# Patient Record
Sex: Female | Born: 1970 | Race: White | Hispanic: No | Marital: Married | Smoking: Never smoker
Health system: Southern US, Community
[De-identification: ages and names within clinical notes are randomized; demographics above are authoritative.]

## PROBLEM LIST (undated history)

## (undated) DIAGNOSIS — Z9889 Other specified postprocedural states: Secondary | ICD-10-CM

## (undated) DIAGNOSIS — T4145XA Adverse effect of unspecified anesthetic, initial encounter: Secondary | ICD-10-CM

## (undated) DIAGNOSIS — Z853 Personal history of malignant neoplasm of breast: Secondary | ICD-10-CM

## (undated) DIAGNOSIS — R112 Nausea with vomiting, unspecified: Secondary | ICD-10-CM

## (undated) DIAGNOSIS — T8859XA Other complications of anesthesia, initial encounter: Secondary | ICD-10-CM

## (undated) HISTORY — PX: TYMPANOSTOMY TUBE PLACEMENT: SHX32

## (undated) HISTORY — PX: BREAST ENHANCEMENT SURGERY: SHX7

## (undated) HISTORY — PX: TONSILLECTOMY: SUR1361

---

## 2015-12-02 ENCOUNTER — Telehealth: Payer: Self-pay | Admitting: *Deleted

## 2015-12-02 DIAGNOSIS — C50411 Malignant neoplasm of upper-outer quadrant of right female breast: Secondary | ICD-10-CM

## 2015-12-02 NOTE — Telephone Encounter (Signed)
Confirmed [facility] for 12/08/15 at 815am .  Instructions and contact information given.

## 2015-12-08 ENCOUNTER — Encounter: Payer: Self-pay | Admitting: Genetic Counselor

## 2015-12-08 ENCOUNTER — Ambulatory Visit (HOSPITAL_BASED_OUTPATIENT_CLINIC_OR_DEPARTMENT_OTHER): Payer: 59 | Admitting: Oncology

## 2015-12-08 ENCOUNTER — Encounter: Payer: Self-pay | Admitting: Nurse Practitioner

## 2015-12-08 ENCOUNTER — Ambulatory Visit: Payer: 59 | Attending: Surgery | Admitting: Physical Therapy

## 2015-12-08 ENCOUNTER — Ambulatory Visit
Admission: RE | Admit: 2015-12-08 | Discharge: 2015-12-08 | Disposition: A | Discharge status: Home / Self Care | Payer: 59 | Source: Ambulatory Visit | Attending: Radiation Oncology | Admitting: Radiation Oncology

## 2015-12-08 ENCOUNTER — Encounter: Payer: Self-pay | Admitting: Skilled Nursing Facility1

## 2015-12-08 ENCOUNTER — Ambulatory Visit (HOSPITAL_BASED_OUTPATIENT_CLINIC_OR_DEPARTMENT_OTHER): Payer: 59 | Admitting: Genetic Counselor

## 2015-12-08 ENCOUNTER — Encounter: Payer: Self-pay | Admitting: *Deleted

## 2015-12-08 ENCOUNTER — Encounter: Payer: Self-pay | Admitting: Physical Therapy

## 2015-12-08 ENCOUNTER — Other Ambulatory Visit: Payer: 59

## 2015-12-08 ENCOUNTER — Other Ambulatory Visit (HOSPITAL_BASED_OUTPATIENT_CLINIC_OR_DEPARTMENT_OTHER): Payer: 59

## 2015-12-08 ENCOUNTER — Telehealth: Payer: Self-pay | Admitting: Oncology

## 2015-12-08 VITALS — BP 124/59 | HR 109 | Temp 97.8°F | Resp 18 | Wt 144.6 lb

## 2015-12-08 DIAGNOSIS — R293 Abnormal posture: Secondary | ICD-10-CM | POA: Insufficient documentation

## 2015-12-08 DIAGNOSIS — C50411 Malignant neoplasm of upper-outer quadrant of right female breast: Secondary | ICD-10-CM

## 2015-12-08 DIAGNOSIS — Z806 Family history of leukemia: Secondary | ICD-10-CM

## 2015-12-08 DIAGNOSIS — Z8042 Family history of malignant neoplasm of prostate: Secondary | ICD-10-CM

## 2015-12-08 DIAGNOSIS — D0511 Intraductal carcinoma in situ of right breast: Secondary | ICD-10-CM

## 2015-12-08 DIAGNOSIS — Z79811 Long term (current) use of aromatase inhibitors: Secondary | ICD-10-CM | POA: Diagnosis not present

## 2015-12-08 DIAGNOSIS — Z803 Family history of malignant neoplasm of breast: Secondary | ICD-10-CM | POA: Insufficient documentation

## 2015-12-08 DIAGNOSIS — C50911 Malignant neoplasm of unspecified site of right female breast: Secondary | ICD-10-CM | POA: Insufficient documentation

## 2015-12-08 LAB — COMPREHENSIVE METABOLIC PANEL
ALT: 9 U/L (ref 0–55)
ANION GAP: 9 meq/L (ref 3–11)
AST: 12 U/L (ref 5–34)
Albumin: 3.9 g/dL (ref 3.5–5.0)
Alkaline Phosphatase: 53 U/L (ref 40–150)
BILIRUBIN TOTAL: 0.38 mg/dL (ref 0.20–1.20)
BUN: 14.8 mg/dL (ref 7.0–26.0)
CO2: 27 meq/L (ref 22–29)
Calcium: 9.6 mg/dL (ref 8.4–10.4)
Chloride: 106 mEq/L (ref 98–109)
Creatinine: 0.8 mg/dL (ref 0.6–1.1)
Glucose: 81 mg/dl (ref 70–140)
POTASSIUM: 4.1 meq/L (ref 3.5–5.1)
SODIUM: 141 meq/L (ref 136–145)
TOTAL PROTEIN: 7.2 g/dL (ref 6.4–8.3)

## 2015-12-08 LAB — CBC WITH DIFFERENTIAL/PLATELET
BASO%: 0.4 % (ref 0.0–2.0)
BASOS ABS: 0 10*3/uL (ref 0.0–0.1)
EOS ABS: 0.1 10*3/uL (ref 0.0–0.5)
EOS%: 1.5 % (ref 0.0–7.0)
HCT: 42.8 % (ref 34.8–46.6)
HGB: 14.3 g/dL (ref 11.6–15.9)
LYMPH%: 24.1 % (ref 14.0–49.7)
MCH: 31.3 pg (ref 25.1–34.0)
MCHC: 33.5 g/dL (ref 31.5–36.0)
MCV: 93.5 fL (ref 79.5–101.0)
MONO#: 0.5 10*3/uL (ref 0.1–0.9)
MONO%: 6.3 % (ref 0.0–14.0)
NEUT%: 67.7 % (ref 38.4–76.8)
NEUTROS ABS: 4.9 10*3/uL (ref 1.5–6.5)
PLATELETS: 311 10*3/uL (ref 145–400)
RBC: 4.57 10*6/uL (ref 3.70–5.45)
RDW: 12.5 % (ref 11.2–14.5)
WBC: 7.3 10*3/uL (ref 3.9–10.3)
lymph#: 1.8 10*3/uL (ref 0.9–3.3)

## 2015-12-08 MED ORDER — TAMOXIFEN CITRATE 20 MG PO TABS: 20.0000 mg | ORAL_TABLET | Freq: Every day | ORAL | Status: AC

## 2015-12-08 NOTE — Therapy (Signed)
[place], [place], [address] Phone: [phone]   Fax:  [phone]  Physical Therapy Evaluation  Patient Details  Name: Belinda White MRN: [id number] Date of Birth: 1971/03/27 Referring Provider: Dr. Erroll Luna  Encounter Date: 12/08/2015      PT End of Session - 12/08/15 1036    Visit Number 1   Number of Visits 1   PT Start Time 0938   PT Stop Time 4034  Also saw pt from 1005-1015 for 29 minutes total   PT Time Calculation (min) 19 min   Activity Tolerance Patient tolerated treatment well   Behavior During Therapy [facility] for tasks assessed/performed      Past Medical History  Diagnosis Date  . Breast cancer [facility])     Past Surgical History  Procedure Laterality Date  . Cesarean section    . Breast enhancement surgery    . Tonsillectomy      There were no vitals filed for this visit.  Visit Diagnosis:  Breast cancer, female, right - Plan: PT plan of care cert/re-cert  Abnormal posture - Plan: PT plan of care cert/re-cert      Subjective Assessment - 12/08/15 1024    Subjective Patient reports she is here today to meet with her medical team for her newly diagnosed right breast cancer.   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  It is ER/PR positive.  Her breast multi-disciplinary team met to discuss her recommended treatment plan prior to seeing her today.   Patient Stated Goals Reduce lymphedema risk and learn post op shoulder ROM HEP   Currently in Pain? No/denies            [facility] PT Assessment - 12/08/15 0001    Assessment   Medical Diagnosis Right breast cancer   Referring Provider Dr. Marcello Moores [place]   Onset Date/Surgical Date 11/24/15   Hand Dominance Right   Prior Therapy none   Precautions   Precautions Other (comment)   Precaution Comments Active  breast cancer   Restrictions   Weight Bearing Restrictions No   Balance Screen   Has the patient fallen in the past 6 months No   Has the patient had a decrease in activity level because of a fear of falling?  No   Is the patient reluctant to leave their home because of a fear of falling?  No   Home Ecologist residence   Living Arrangements Spouse/significant other;Children  Husband, 25 y.o. stepson and 18 y.o. 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   Leisure She does not exercise   Cognition   Overall Cognitive Status Within Functional Limits for tasks assessed   Posture/Postural Control   Posture/Postural Control Postural limitations   Postural Limitations Rounded Shoulders;Forward head   ROM / Strength   AROM / PROM / Strength AROM;Strength   AROM   AROM Assessment Site Shoulder   Right/Left Shoulder Right;Left   Right Shoulder Extension 55 Degrees   Right Shoulder Flexion 158 Degrees   Right Shoulder ABduction 160 Degrees   Right Shoulder Internal Rotation 72 Degrees   Right Shoulder External Rotation 81 Degrees   Left Shoulder  Extension 50 Degrees   Left Shoulder Flexion 144 Degrees   Left Shoulder ABduction 167 Degrees   Left Shoulder Internal Rotation 68 Degrees   Left Shoulder External Rotation 83 Degrees   Strength   Overall Strength Within functional limits for tasks performed           LYMPHEDEMA/ONCOLOGY QUESTIONNAIRE - 12/08/15 1029    Type   Cancer Type Right breast cancer   Lymphedema Assessments   Lymphedema Assessments Upper extremities   Right Upper Extremity Lymphedema   10 cm Proximal to Olecranon Process 25.5 cm   Olecranon Process 22.6 cm   10 cm Proximal to Ulnar Styloid Process 21.1 cm   Just Proximal to Ulnar Styloid Process 14.3 cm    Across Hand at [employer] 17.3 cm   At [place] of 2nd Digit 5.5 cm   Left Upper Extremity Lymphedema   10 cm Proximal to Olecranon Process 25.5 cm   Olecranon Process 22 cm   10 cm Proximal to Ulnar Styloid Process 19.2 cm   Just Proximal to Ulnar Styloid Process 14.5 cm   Across Hand at [employer] 17.4 cm   At [place] of 2nd Digit 5.6 cm       Patient was instructed today in a home exercise program today for post op shoulder range of motion. These included active assist shoulder flexion in sitting, scapular retraction, wall walking with shoulder abduction, and hands behind head external rotation.  She was encouraged to do these twice a day, holding 3 seconds and repeating 5 times when permitted by her physician.           PT Education - 12/08/15 1036    Education provided Yes   Education Details Lymphedema risk reduction and post op shoulder ROM HEP   Person(s) Educated Patient;Parent(s)   Methods Explanation;Demonstration;Handout   Comprehension Verbalized understanding;Returned demonstration              Breast Clinic Goals - 12/08/15 1047    Patient will be able to verbalize understanding of pertinent lymphedema risk reduction practices relevant to her diagnosis specifically related to skin care.   Time 1   Period Days   Status Achieved   Patient will be able to return demonstrate and/or verbalize understanding of the post-op home exercise program related to regaining shoulder range of motion.   Time 1   Period Days   Status Achieved   Patient will be able to verbalize understanding of the importance of attending the postoperative After Breast Cancer Class for further lymphedema risk reduction education and therapeutic exercise.   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  It is ER/PR  positive.  Her breast multi-disciplinary team met to discuss her recommended treatment plan prior to seeing her today.  She is planning to have genetic testing and if positive, a bilateral mastectomy with a right sentinel node biopsy.  If genetics are negative, she may consider only a right mastectomy and sentinel node biopsy.  She will also begin Tamoxifen neoadjuvantly.  She will benefit from post op PT to regain shoulder ROM and strength and reduce lymphedema risk.   Pt will benefit from skilled therapeutic intervention in order to improve on the following deficits Decreased strength;Decreased knowledge of  precautions;Pain;Impaired UE functional use;Decreased range of motion   Rehab Potential Excellent   Clinical Impairments Affecting Rehab Potential None   PT Frequency One time visit   PT Treatment/Interventions Therapeutic exercise;Patient/family education   PT Next Visit Plan Will f/u after surgery   PT Home Exercise Plan Shoulder ROM HEP for post op   Consulted and Agree with Plan of Care Patient;Family member/caregiver   Family Member Consulted Parents       Patient will follow up at outpatient cancer rehab if needed following surgery.  If the patient requires physical therapy at that time, a specific plan will be dictated and sent to the referring physician for approval. The patient was educated today on appropriate basic range of motion exercises to begin post operatively and the importance of attending the After Breast Cancer class following surgery.  Patient was educated today on lymphedema risk reduction practices as it pertains to recommendations that will benefit the patient immediately following surgery.  She verbalized good understanding.  No additional physical therapy is indicated at this time.      Problem List Patient Active Problem List   Diagnosis Date Noted  . Breast cancer of upper-outer quadrant of right female breast [facility]) 12/02/2015    Annia Friendly,  PT 12/08/2015 10:50 AM   [place], [place], [address] Phone: [phone]   Fax:  [phone]  Name: Belinda White MRN: [id number] Date of Birth: August 18, 1971

## 2015-12-08 NOTE — Progress Notes (Signed)
[place]  [phone] [phone]     ID: Belinda White DOB: 1970-12-29  MR#: [id number]  ONG#:[id number]  Patient Care Team: Leone Haven, MD as PCP - General (Family Medicine) Erroll Luna, MD as Consulting Physician (General Surgery) Thea Silversmith, MD as Consulting Physician (Radiation Oncology) Chauncey Cruel, MD as Consulting Physician (Oncology) PCP: Leone Haven, MD GYN: OTHER MD:  CHIEF COMPLAINT: Ductal carcinoma in situ  CURRENT TREATMENT: Tamoxifen, awaiting definitive surgery   BREAST CANCER HISTORY: Belinda White had routine screening mammography at [facility] 11/24/2015 with tomosynthesis. The breast density was category C. In the upper outer quadrant of the right breast there was an area of pleomorphic calcifications injuring 6 cm. She was recalled for unilateral diagnostic right mammography 11/26/2015, and this confirmed the earlier impression of a large area of suspicious calcifications involving both lower quadrants. Right breast ultrasonography on the same day showed a pattern suggestive of ductal ectasia with intraductal material in all quadrants, but no specific focus and a negative axilla.  On 11/29/2015 the patient underwent biopsy of 2 areas of calcifications, both in the lower quadrants, and both showed (SAA 17-05/10/2005) similar ductal carcinoma in situ, grade 1 or 2, strongly estrogen and progesterone receptor positive.  Her subsequent history is as detailed below  INTERVAL HISTORY: Belinda White was evaluated at the multidisciplinary breast cancer clinic [facility] 05/31/2016 accompanied by both her parents. Her case was also presented in the multidisciplinary breast cancer conference that same morning. At that time a preliminary plan was proposed: Mastectomy with sentinel lymph node sampling, followed by anti-estrogens. Genetics counseling was also recommended.  REVIEW OF SYSTEMS: There were no specific symptoms leading to the  original mammogram, which was routinely scheduled. The patient denies unusual headaches, visual changes, nausea, vomiting, stiff neck, dizziness, or gait imbalance. There has been no cough, phlegm production, or pleurisy, no chest pain or pressure, and no change in bowel or bladder habits. The patient denies fever, rash, bleeding, unexplained fatigue or unexplained weight loss. A detailed review of systems was otherwise entirely negative.  PAST MEDICAL HISTORY: Past Medical History  Diagnosis Date  . Breast cancer ([place])     PAST SURGICAL HISTORY: Past Surgical History  Procedure Laterality Date  . Cesarean section    . Breast enhancement surgery    . Tonsillectomy      FAMILY HISTORY Family History  Problem Relation Age of Onset  . Breast cancer  42  . Breast cancer Maternal Grandmother   The patient's father is 62 and the patient's mother 75 as of March 2017. The patient had no siblings. The maternal grandmother was diagnosed with breast cancer at age 19. She survive  GYNECOLOGIC HISTORY:  No LMP recorded. Menarche age 77 and first live birth age 24, the patient is GX P3. She still having regular periods. She has used oral contraceptives and most recently Depo purpura for contraception. 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 Her husband Belinda White is Librarian, academic of a local company. Daughter Belinda White lives in [place] where she works as an Surveyor, minerals. Daughter Belinda White lives in [place] and is unemployed at present. Daughter Belinda White is 67 and lives with the patient at [place].    ADVANCED DIRECTIVES: Not in place   HEALTH MAINTENANCE: Social History  Substance Use Topics  . Smoking status: Never Smoker   . Smokeless tobacco: None  . Alcohol Use: No     Colonoscopy:  PAP:  Bone density:  Lipid panel:  Not on File  No current outpatient prescriptions on file.   No current facility-administered medications for this  visit.    OBJECTIVE: Young white woman who appears well Filed Vitals:   12/08/15 0915  BP: 124/59  Pulse: 109  Temp: 97.8 F (36.6 C)  Resp: 18     There is no height on file to calculate BMI.    ECOG FS:0 - Asymptomatic  Ocular: Sclerae unicteric, pupils equal, round and reactive to light Ear-nose-throat: Oropharynx clear and moist Lymphatic: No cervical or supraclavicular adenopathy Lungs no rales or rhonchi, good excursion bilaterally Heart regular rate and rhythm, no murmur appreciated Abd soft, nontender, positive bowel sounds MSK no focal spinal tenderness, no joint edema Neuro: non-focal, well-oriented, appropriate affect Breasts: The right breast is status post recent biopsy. I do not palpate any mass and there is no nipple or skin change of concern. The right axilla is benign. The left breast is unremarkable   LAB RESULTS:  CMP     Component Value Date/Time   NA 141 12/08/2015 0834   K 4.1 12/08/2015 0834   CO2 27 12/08/2015 0834   GLUCOSE 81 12/08/2015 0834   BUN 14.8 12/08/2015 0834   CREATININE 0.8 12/08/2015 0834   CALCIUM 9.6 12/08/2015 0834   PROT 7.2 12/08/2015 0834   ALBUMIN 3.9 12/08/2015 0834   AST 12 12/08/2015 0834   ALT 9 12/08/2015 0834   ALKPHOS 53 12/08/2015 0834   BILITOT 0.38 12/08/2015 0834    INo results found for: SPEP, UPEP  Lab Results  Component Value Date   WBC 7.3 12/08/2015   NEUTROABS 4.9 12/08/2015   HGB 14.3 12/08/2015   HCT 42.8 12/08/2015   MCV 93.5 12/08/2015   PLT 311 12/08/2015      Chemistry      Component Value Date/Time   NA 141 12/08/2015 0834   K 4.1 12/08/2015 0834   CO2 27 12/08/2015 0834   BUN 14.8 12/08/2015 0834   CREATININE 0.8 12/08/2015 0834      Component Value Date/Time   CALCIUM 9.6 12/08/2015 0834   ALKPHOS 53 12/08/2015 0834   AST 12 12/08/2015 0834   ALT 9 12/08/2015 0834   BILITOT 0.38 12/08/2015 0834       No results found for: LABCA2  No components found for: LABCA125  No  results for input(s): INR in the last 168 hours.  Urinalysis No results found for: COLORURINE, APPEARANCEUR, LABSPEC, PHURINE, GLUCOSEU, HGBUR, BILIRUBINUR, KETONESUR, PROTEINUR, UROBILINOGEN, NITRITE, LEUKOCYTESUR    ELIGIBLE FOR AVAILABLE RESEARCH PROTOCOL: No  STUDIES: Outside studies reviewed  ASSESSMENT: 45 y.o. [place], [place] woman status post right breast lower quadrant biopsy 2 for ductal carcinoma in situ, grade 1 or 2, estrogen and progesterone receptor positive.  (1) right mastectomy with sentinel lymph node sampling planned  (2) genetics testing pending; if the patient proves to carry a deleterious mutation she has decided she would want bilateral mastectomies  (3) neoadjuvant tamoxifen started as there may be significant delays to definitive surgery while genetics testinging and plastic surgery consultation is obtained  PLAN: We spent the better part of today's hour-long appointment discussing the biology of breast cancer in general, and the specifics of the patient's tumor in particular.The patient understands that in noninvasive ductal carcinoma, also called ductal carcinoma in situ ("DCIS") the breast cancer cells remain trapped in the ducts were they started. They cannot travel to a vital organ. For that reason these cancers in themselves are not  life-threatening.  If the whole breast is removed then all the ducts are removed and since the cancer cells are trapped in the ducts, the cure rate with mastectomy for noninvasive breast cancer is approximately 99%. Nevertheless we recommend lumpectomy, because there is no survival advantage to mastectomy and because the cosmetic result is generally superior with breast conservation.  Darnice qualifies for genetic testing, and this is planned for 12/09/2015. In patients who carry a deleterious mutations such as for example BRCA, the risk of a new breast cancer developing may be sufficiently great that the patient may choose  bilateral mastectomies. However if the patient wishes to keep her breasts in that situation it is safe to do so. That would require intensified screening, which generally means not only yearly mammography but a yearly breast MRI as well. Of course, if there is a deleterious mutation bilateral oophorectomy would be necessary as there is no standard screening protocol for ovarian cancer.  In estrogen receptor positive cancers like Cassity's, anti-estrogens can also be considered. They will further reduce the risk of recurrence by one half. In addition anti-estrogens will lower the risk of a new breast cancer developing in either breast also by one half. That risk approaches 1% per year. Anti-estrogens reduce it to 1/2%.  Accordingly the overall plan is for genetics testing, to help decide the extensive surgery; at least unilateral mastectomy with reconstruction; then anti-estrogens. Because his significant delays anticipated between the time of genetics testing, plastics discussion, and surgical scheduling, we are starting the patient on neoadjuvant tamoxifen. We discussed the possible toxicities, side effects and complications of this agent. This will also be useful in that the patient will now whether she can tolerate this for 5 years postop or not.  Edwinna has a good understanding of the overall plan. She agrees with it. She knows the goal of treatment in her case is cure. She will call with any problems that may develop before her next visit here.  Chauncey Cruel, MD   12/08/2015 9:42 AM Medical Oncology and Hematology [facility] [address] [place], [place] [address] Tel. [phone]    Fax. [phone]

## 2015-12-08 NOTE — Progress Notes (Signed)
Belinda White is a very pleasant 45 y.o. female from [place], [place] with newly diagnosed ductal carcinoma in situ of the right breast.  Biopsy results revealed the tumor's prognostic profile is ER positive and PR positive.  She presents today with her parents to the [facility]) for treatment consideration and recommendations from the breast surgeon, radiation oncologist, and medical oncologist.     I briefly met with Belinda White and her parents during her [facility] visit today. We discussed the purpose of the Survivorship Clinic, which will include monitoring for recurrence, coordinating completion of age and gender-appropriate cancer screenings, promotion of overall wellness, as well as managing potential late/long-term side effects of anti-cancer treatments.    The treatment plan for Belinda White will likely include surgery, reconstruction, and neaodjuvant anti-estrogen therapy with tamoxifen.  She will meet with the Genetics Counselor due to her family history of breast cancer and age.  As of today, the intent of treatment for Belinda White is cure, therefore she will be eligible for the Survivorship Clinic upon her completion of treatment.  Her survivorship care plan (SCP) document will be drafted and updated throughout the course of her treatment trajectory. She will receive the SCP in an office visit with myself in the Survivorship Clinic once she has completed treatment.   Belinda White was encouraged to ask questions and all questions were answered to her satisfaction.  She was given my business card and encouraged to contact me with any concerns regarding survivorship.  I look forward to participating in her care.   Kenn File, [place] [phone]

## 2015-12-08 NOTE — Progress Notes (Signed)
[facility] Breast Clinic Psychosocial Distress Screening Clinical Social Work  Clinical Social Work had planned to meet with pt at [facility] to introduce self, explain role of CSW/Support Team and review distress screening protocol. Pt was sent to Genetics Appt and CSW missed seeing pt as a result.  The patient scored a 3 on the Psychosocial Distress Thermometer which indicates little distress. Per Belinda White, pt was provided with CSW/Support Team info and CSW can follow up with pt at future appointments as needs arise.   ONCBCN DISTRESS SCREENING 12/08/2015  Screening Type Initial Screening  Distress experienced in past week (1-10) 3  Family Problem type Children  Emotional problem type Nervousness/Anxiety;Adjusting to illness  Referral to clinical social work Yes  Referral to support programs Yes    Clinical Social Worker follow up needed: No.  If yes, follow up plan:  Belinda White, [place]  [facility] Phone: [phone] Fax: [phone]

## 2015-12-08 NOTE — Progress Notes (Signed)
Subjective:     Patient ID: Belinda White, female   DOB: Jul 27, 1971, 45 y.o.   MRN: [id number]  HPI   Review of Systems     Objective:   Physical Exam For the patient to understand and be given the tools to implement a healthy plant based diet during their cancer diagnosis.     Assessment:     Patient was seen today and found to be in good spirits and accompanied by her parents.Marland Kitchen Pts wt 144 pounds. Pt was responsive to the nutrition recommendations and feels good about eating a healthy diet. Pt states she is going to start walking 30 minutes every day. Pt states her family of 4 drinks 6 gallons of milk every week. Pt states she drinks half a large [employer] sweet tea every morning and 2 cups of chocolate milk every day.      Plan:     Dietitian educated the patient on implementing a plant based diet by incorporating more plant proteins, fruits, and vegetables. As a part of a healthy routine physical activity was discussed. Dietitian offered the option of trying to make small changes like only drinking one cup of chocolate milk.  The importance of legitimate, evidence based information was discussed and examples were given. A folder of evidence based information with a focus on a plant based diet and general nutrition during cancer was given to the patient.  As a part of the continuum of care the cancer dietitian's contact information was given to the patient in the event they would like to have a follow up appointment.

## 2015-12-08 NOTE — Patient Instructions (Signed)
Physical Therapy Information for After Breast Cancer Surgery/Treatment:   Lymphedema is a swelling condition that you may be at risk for in your arm if you have lymph nodes removed from the armpit area.  After a sentinel node biopsy, the risk is approximately 5-9% and is higher after an axillary node dissection.  There is treatment available for this condition and it is not life-threatening.  Contact your physician or physical therapist with concerns.  You may begin the 4 shoulder/posture exercises (see additional sheet) when permitted by your physician (typically a week after surgery).  If you have drains, you may need to wait until those are removed before beginning range of motion exercises.  A general recommendation is to not lift your arms above shoulder height until drains are removed.  These exercises should be done to your tolerance and gently.  This is not a "no pain/no gain" type of recovery so listen to your body and stretch into the range of motion that you can tolerate, stopping if you have pain.  If you are having immediate reconstruction, ask your plastic surgeon about doing exercises as he or she may want you to wait.  We encourage you to attend the free one time ABC (After Breast Cancer) class offered by  [facility].  You will learn information related to lymphedema risk, prevention and treatment and additional exercises to regain mobility following surgery.  You can call [phone] for more information.  This is offered the 1st and 3rd Monday of each month.  You only attend the class one time.  While undergoing any medical procedure or treatment, try to avoid blood pressure being taken or needle sticks from occurring on the arm on the side of cancer.   This recommendation begins after surgery and continues for the rest of your life.  This may help reduce your risk of getting lymphedema (swelling in your arm).  An excellent resource for those seeking information  on lymphedema is the National Lymphedema Network's web site. It can be accessed at www.lymphnet.org  If you notice swelling in your hand, arm or breast at any time following surgery (even if it is many years from now), please contact your doctor or physical therapist to discuss this.  Lymphedema can be treated at any time but it is easier for you if it is treated early on.  If you feel like your shoulder motion is not returning to normal in a reasonable amount of time, please contact your surgeon or physical therapist.  Belinda White, PT, CLT [phone]; [address] [place], [place] [address] ABC CLASS After Breast Cancer Class  After Breast Cancer Class is a specially designed exercise class to assist you in a safe recover after having breast cancer surgery.  In this class you will learn how to get back to full function whether your drains were just removed or if you had surgery a month ago.  This one-time class is held the 1st and 3rd Monday of every month from 11:00 a.m. until 12:00 noon at the [facility] located at [address] [place], [place] [address]  This class is FREE and space is limited. For more information or to register for the next available class, call [phone].  Class Goals   Understand specific stretches to improve the flexibility of you chest and shoulder.  Learn ways to safely strengthen your upper body and improve your posture.  Understand the warning signs of infection and why   you may be at risk for an arm infection.  Learn about Lymphedema and prevention.  ** You do not attend this class until after surgery.  Drains must be removed to participate  Patient will follow up at outpatient cancer rehab if needed following surgery.  If the patient requires physical therapy at that time, a specific plan will be dictated and sent to the referring physician for approval. The patient was educated today on appropriate basic range  of motion exercises to begin post operatively and the importance of attending the After Breast Cancer class following surgery.  Patient was educated today on lymphedema risk reduction practices as it pertains to recommendations that will benefit the patient immediately following surgery.  She verbalized good understanding.  No additional physical therapy is indicated at this time.    

## 2015-12-08 NOTE — Telephone Encounter (Signed)
Spoke with patient to confirm June 1 appt date/time

## 2015-12-08 NOTE — Progress Notes (Signed)
REFERRING PROVIDER: Leone Haven, MD [address] [place] [address]   Lurline Del, MD  PRIMARY PROVIDER:  Leone Haven, MD  PRIMARY REASON FOR VISIT:  1. Breast cancer of upper-outer quadrant of right female breast ([place])   2. Family history of breast cancer   3. Family history of leukemia   4. Family history of prostate cancer      HISTORY OF PRESENT ILLNESS:   Belinda White, a 45 y.o. female, was seen for a [facility] cancer genetics consultation at the request of Dr. Jana Hakim due to a personal and family history of cancer.  Belinda White presents to clinic today to discuss the possibility of a hereditary predisposition to cancer, genetic testing, and to further clarify her future cancer risks, as well as potential cancer risks for family members.   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 This will be treated with unilateral mastectomy and tamoxifen.  If genetics is positive she will consider double mastectomy.   CANCER HISTORY:   No history exists.     HORMONAL RISK FACTORS:  Menarche was at age 3.  First live birth at age 63.  OCP use for approximately 0 years.  Ovaries intact: yes.  Hysterectomy: no.  Menopausal status: premenopausal.  HRT use: 0 years. Colonoscopy: no; not examined. Mammogram within the last year: yes. Number of breast biopsies: 2. Up to date with pelvic exams:  yes. Any excessive radiation exposure in the past:  no  Past Medical History  Diagnosis Date  . Breast cancer ([place])   . Family history of breast cancer   . Family history of leukemia   . Family history of prostate cancer     Past Surgical History  Procedure Laterality Date  . Cesarean section    . Breast enhancement surgery    . Tonsillectomy      Social History   Social History  . Marital Status: Married    Spouse Name: Pilar Plate  . Number of Children: 3  . Years of Education: N/A   Social  History Main Topics  . Smoking status: Never Smoker   . Smokeless tobacco: None  . Alcohol Use: No  . Drug Use: No  . Sexual Activity: Not Asked   Other Topics Concern  . None   Social History Narrative     FAMILY HISTORY:  We obtained a detailed, 4-generation family history.  Significant diagnoses are listed below: Family History  Problem Relation Age of Onset  . Breast cancer Maternal Grandmother 60  . COPD Maternal Grandmother   . Leukemia Father     dx in his 84s-40s  . Heart attack Paternal Uncle   . Prostate cancer Maternal Grandfather   . Lung cancer Maternal Grandfather   . Stroke Paternal Grandmother   . Lung cancer Paternal Grandfather   . Diabetes type I Maternal Uncle   . Breast cancer Cousin     mother's maternal cousin  . Breast cancer Cousin     mother's maternal cousin's daughter  . Breast cancer Cousin     father's maternal cousin    The patient has three daughters who are healthy and cancer free.  She is the only child to her parents.  Her father was diagnosed with leukemia in his 105s-40s.  He had one brother who died of a heart attack.  His mother died of a stroke and his father had lung cancer.  His  father had several brothers and sisters who all died of cancer.  His mother had one brother who had a daughter with breast cancer.  The patient's mother is cancer free at 20.  She has only one ovary.  She had three brothers, one who died of DM type 1 at 55.  Her mother (the patient's grandmother) had breast cancer at 87, and her father (the patient's grandfather) had prostate cancer at 48.  The patient's maternal grandmother had at least two brothers, one who had a granddaughter with breast cancer and another who had a daughter with breast cancer.  Patient's maternal ancestors are of unknown descent, and paternal ancestors are of [place] descent. There is no reported Ashkenazi Jewish ancestry. There is no known consanguinity.  GENETIC COUNSELING ASSESSMENT: Belinda White is a 45 y.o. female with a personal and family history of breast cancer which is somewhat suggestive of a hereditary cancer syndrome and predisposition to cancer. We, therefore, discussed and recommended the following at today's visit.   DISCUSSION: We discussed that about 5-10% of breast cancer is hereditary with most cases the result of BRCA mutations.  Other genes can increase the risk for breast cancer, including PALB2, ATM and CHEK2.  We reviewed the characteristics, features and inheritance patterns of hereditary cancer syndromes. We also discussed genetic testing, including the appropriate family members to test, the process of testing, insurance coverage and turn-around-time for results. We discussed the implications of a negative, positive and/or variant of uncertain significant result. We recommended Belinda White pursue genetic testing for the Breast/Ovarian cancer gene panel. The Breast/Ovarian gene panel offered by [facility] includes sequencing and rearrangement analysis for the following 20 genes:  ATM, BARD1, BRCA1, BRCA2, BRIP1, CDH1, CHEK2, EPCAM, FANCC, MLH1, MSH2, MSH6, NBN, PALB2, PMS2, PTEN, RAD51C, RAD51D, TP53, and XRCC2.     Based on Belinda White's personal and family history of cancer, she meets medical criteria for genetic testing. Despite that she meets criteria, she may still have an out of pocket cost. We discussed that if her out of pocket cost for testing is over $100, the laboratory will call and confirm whether she wants to proceed with testing.  If the out of pocket cost of testing is less than $100 she will be billed by the genetic testing laboratory.   PLAN: After considering the risks, benefits, and limitations, Belinda White  provided informed consent to pursue genetic testing and the blood sample was sent to [facility] for analysis of the Breast/[place] cancer. Results have been placed on a [facility], which should make them available within approximately 2 weeks' time, at  which point they will be disclosed by telephone to Belinda White, as will any additional recommendations warranted by these results. Belinda White will receive a summary of her genetic counseling visit and a copy of her results once available. This information will also be available in Epic. We encouraged Belinda White to remain in contact with cancer genetics annually so that we can continuously update the family history and inform her of any changes in cancer genetics and testing that may be of benefit for her family. Belinda White's questions were answered to her satisfaction today. Our contact information was provided should additional questions or concerns arise.  Lastly, we encouraged Belinda White to remain in contact with cancer genetics annually so that we can continuously update the family history and inform her of any changes in cancer genetics and testing that may be of benefit for this family.   Ms.  White's questions were answered to her satisfaction today. Our contact information was provided should additional questions or concerns arise. Thank you for the referral and allowing [place] to share in the care of your patient.   Wrangler Penning P. Florene Glen, [place], [facility] Certified Genetic Counselor [email or web address] phone: [phone]  The patient was seen for a total of 45 minutes in face-to-face genetic counseling.  This patient was discussed with Drs. Magrinat, Lindi Adie and/or Burr Medico who agrees with the above.    _______________________________________________________________________ For Office Staff:  Number of people involved in session: 2 Was an Intern/ student involved with case: no

## 2015-12-13 ENCOUNTER — Telehealth: Payer: Self-pay | Admitting: *Deleted

## 2015-12-13 NOTE — Telephone Encounter (Signed)
Spoke to pt concerning Belinda White from 12/08/15. Denies questions or concerns regarding dx or treatment care plan. Encourage pt to call with needs. Received verbal understanding. Contact information provided.

## 2015-12-16 ENCOUNTER — Encounter: Payer: Self-pay | Admitting: Genetic Counselor

## 2015-12-16 ENCOUNTER — Ambulatory Visit: Payer: Self-pay | Admitting: Genetic Counselor

## 2015-12-16 ENCOUNTER — Telehealth: Payer: Self-pay | Admitting: Genetic Counselor

## 2015-12-16 DIAGNOSIS — Z1379 Encounter for other screening for genetic and chromosomal anomalies: Secondary | ICD-10-CM

## 2015-12-16 DIAGNOSIS — Z803 Family history of malignant neoplasm of breast: Secondary | ICD-10-CM

## 2015-12-16 DIAGNOSIS — C50411 Malignant neoplasm of upper-outer quadrant of right female breast: Secondary | ICD-10-CM

## 2015-12-16 DIAGNOSIS — Z806 Family history of leukemia: Secondary | ICD-10-CM

## 2015-12-16 DIAGNOSIS — Z8042 Family history of malignant neoplasm of prostate: Secondary | ICD-10-CM

## 2015-12-16 NOTE — Progress Notes (Signed)
HPI: Belinda White was previously seen in the [facility] clinic due to a personal and family history of cancer and concerns regarding a hereditary predisposition to cancer. Please refer to our prior cancer genetics clinic note for more information regarding Belinda White's medical, social and family histories, and our assessment and recommendations, at the time. Belinda White's recent genetic test results were disclosed to her, as were recommendations warranted by these results. These results and recommendations are discussed in more detail below.  FAMILY HISTORY:  We obtained a detailed, 4-generation family history.  Significant diagnoses are listed below: Family History  Problem Relation Age of Onset  . Breast cancer Maternal Grandmother 77  . COPD Maternal Grandmother   . Leukemia Father     dx in his 57s-40s  . Heart attack Paternal Uncle   . Prostate cancer Maternal Grandfather   . Lung cancer Maternal Grandfather   . Stroke Paternal Grandmother   . Lung cancer Paternal Grandfather   . Diabetes type I Maternal Uncle   . Breast cancer Cousin     mother's maternal cousin  . Breast cancer Cousin     mother's maternal cousin's daughter  . Breast cancer Cousin     father's maternal cousin    The patient has three daughters who are healthy and cancer free. She is the only child to her parents. Her father was diagnosed with leukemia in his 62s-40s. He had one brother who died of a heart attack. His mother died of a stroke and his father had lung cancer. His father had several brothers and sisters who all died of cancer. His mother had one brother who had a daughter with breast cancer. The patient's mother is cancer free at 69. She has only one ovary. She had three brothers, one who died of DM type 1 at 21. Her mother (the patient's grandmother) had breast cancer at 89, and her father (the patient's grandfather) had prostate cancer at 55. The patient's maternal grandmother had  at least two brothers, one who had a granddaughter with breast cancer and another who had a daughter with breast cancer. Patient's maternal ancestors are of unknown descent, and paternal ancestors are of [place] descent. There is no reported Ashkenazi Jewish ancestry. There is no known consanguinity.  GENETIC TEST RESULTS: At the time of Belinda White's visit, we recommended she pursue genetic testing of the Breast/Ovarian cancer gene panel. The Breast/Ovarian gene panel offered by [facility] includes sequencing and rearrangement analysis for the following 20 genes:  ATM, BARD1, BRCA1, BRCA2, BRIP1, CDH1, CHEK2, EPCAM, FANCC, MLH1, MSH2, MSH6, NBN, PALB2, PMS2, PTEN, RAD51C, RAD51D, TP53, and XRCC2.   The report date is December 16, 2015.  Genetic testing was normal, and did not reveal a deleterious mutation in these genes. The test report has been scanned into EPIC and is located under the Molecular Pathology section of the Results Review tab.   We discussed with Belinda White that since the current genetic testing is not perfect, it is possible there may be a gene mutation in one of these genes that current testing cannot detect, but that chance is small. We also discussed, that it is possible that another gene that has not yet been discovered, or that we have not yet tested, is responsible for the cancer diagnoses in the family, and it is, therefore, important to remain in touch with cancer genetics in the future so that we can continue to offer Belinda White the most up to date genetic  testing.   CANCER SCREENING RECOMMENDATIONS: This result is reassuring and indicates that Belinda White likely does not have an increased risk for a future cancer due to a mutation in one of these genes. This normal test also suggests that Belinda White's cancer was most likely not due to an inherited predisposition associated with one of these genes.  Most cancers happen by chance and this negative test suggests that her cancer falls into this  category.  We, therefore, recommended she continue to follow the cancer management and screening guidelines provided by her oncology and primary healthcare provider.   RECOMMENDATIONS FOR FAMILY MEMBERS: Women in this family might be at some increased risk of developing cancer, over the general population risk, simply due to the family history of cancer. We recommended women in this family have a yearly mammogram beginning at age 35, or 64 years younger than the earliest onset of cancer, an an annual clinical breast exam, and perform monthly breast self-exams. Women in this family should also have a gynecological exam as recommended by their primary provider. All family members should have a colonoscopy by age 35.  FOLLOW-UP: Lastly, we discussed with Belinda White that cancer genetics is a rapidly advancing field and it is possible that new genetic tests will be appropriate for her and/or her family members in the future. We encouraged her to remain in contact with cancer genetics on an annual basis so we can update her personal and family histories and let her know of advances in cancer genetics that may benefit this family.   Our contact number was provided. Belinda White's questions were answered to her satisfaction, and she knows she is welcome to call us at anytime with additional questions or concerns.   Roma Kayser, MS, [facility] Certified Genetic Counselor [email or web address]

## 2015-12-16 NOTE — Telephone Encounter (Signed)
Revealed negative genetic test results.  Discussed that this does not indicate that there is a hereditary cause for her cancer.  She should keep in contact with genetics for any updated information in the future.

## 2015-12-31 ENCOUNTER — Ambulatory Visit: Payer: Self-pay | Admitting: Surgery

## 2015-12-31 DIAGNOSIS — D0511 Intraductal carcinoma in situ of right breast: Secondary | ICD-10-CM

## 2015-12-31 NOTE — H&P (Signed)
Belinda White 12/31/2015 9:11 AM Location: [facility] Patient #: [id number] DOB: [birth year] Married / Language: English / Race: White Female  History of Present Illness Marcello Moores A. Cherlynn Popiel MD; 12/31/2015 12:43 PM) Patient words: atient words: patient sent at the request of Dr. Pablo Ledger for abnormal mammogram. She has a history of bilateral saline implants. On her most recenomorphic calcification she had a 10 cm area of pleomorphic calcifications involving the right breast. Core biopsy showed DCIS. She was seen today in the multidisciplinary clinic. She has a family history of breast cancer. Denies any history of breast pain, nipple discharge or masses. Her implants have been in at least 5 years she thinks.    Patient returns after seeing Dr. Carol Ada and getting her genetic testing which is negative. She has interest in bilateral nipple sparing mastectomies. She has implants and all ready and may have to have these removed. I discussed with her the rationale for right simple mastectomy. I discussed that there is no survival benefit to bilateral mastectomies after reviewing the literature but given her young age she would have a lifetime risk of a new breast cancer in the left breast of well over 30%.  The patient is a 45 year old female.   Allergies (Sonya Bynum, CMA; 12/31/2015 9:12 AM) No Known Drug Allergies 12/31/2015  Medication History (Sonya Bynum, CMA; 12/31/2015 9:12 AM) Tamoxifen Citrate (20MG  Tablet, Oral) Active. Medications Reconciled    Vitals (Sonya Bynum CMA; 12/31/2015 9:12 AM) 12/31/2015 9:11 AM Weight: 145 lb Height: 66in Body Surface Area: 1.74 m Body Mass Index: 23.4 kg/m  Temp.: 68F(Temporal)  Pulse: 76 (Regular)  BP: 128/80 (Sitting, Left Arm, Standard)      Physical Exam (Shallyn Constancio A. Ival Pacer MD; 12/31/2015 12:43 PM)  General Mental Status-Alert. General Appearance-Consistent with stated age. Hydration-Well  hydrated. Voice-Normal.  Breast Breast - Left-Symmetric, Non Tender, No Biopsy scars, no Dimpling, No Inflammation, No Lumpectomy scars, No Mastectomy scars, No Peau d' Orange. Breast - Right-Symmetric, Non Tender, No Biopsy scars, no Dimpling, No Inflammation, No Lumpectomy scars, No Mastectomy scars, No Peau d' Orange. Breast Lump-No Palpable Breast Mass.  Cardiovascular Cardiovascular examination reveals -normal heart sounds, regular rate and rhythm with no murmurs and normal pedal pulses bilaterally.  Lymphatic Head & Neck  General Head & Neck Lymphatics: Bilateral - Description - Normal. Axillary  General Axillary Region: Bilateral - Description - Normal. Tenderness - Non Tender.    Assessment & Plan (Myalee Stengel A. Dolorez Jeffrey MD; 12/31/2015 12:43 PM)  BREAST NEOPLASM, TIS (DCIS), RIGHT (D05.11) Impression: pt has seen genetics and is negative. Given her young age she has a lifetime risk of over 30 % for a new breast cancer on the left  she is leaning toward bilateral mastectomy with left prophylactic due to lifetime risk  she will let me know  she has seen Dr Iran Planas  Would proceed with NSM in her case with right SLN mapping  discussed options of simple mastectomy versus nipple preservation withplastic surgery versus delayed reconstruction Discussed sentinel lymph node mapping as well. Risk of sentinel lymph node mapping include bleeding, infection, lymphedema, shoulder pain. stiffness, dye allergy. cosmetic deformity , nipple loss blood clots, death, need for more surgery. Pt agres to proceed. Discussed treatment options for breast cancer to include breast conservation vs mastectomy with reconstruction. Pt has decided on mastectomy. Risk include bleeding, infection, flap necrosis, pain, numbness, numbness, pain, nipple loss, recurrence, hematoma, other surgery needs. Pt understands and agrees to proceed.   Discussed treatment options for  breast cancer to include breast  conservation vs mastectomy with reconstruction. Pt has decided on mastectomy. Risk include bleeding, infection, flap necrosis, pain, numbness, recurrence, hematoma, other surgery needs. Pt understands and agrees to proceed.  Current Plans You are being scheduled for surgery - Our schedulers will call you.  You should hear from our office's scheduling department within 5 working days about the location, date, and time of surgery. We try to make accommodations for patient's preferences in scheduling surgery, but sometimes the OR schedule or the surgeon's schedule prevents [place] from making those accommodations.  If you have not heard from our office [phone]) in 5 working days, call the office and ask for your surgeon's nurse.  If you have other questions about your diagnosis, plan, or surgery, call the office and ask for your surgeon's nurse.  We discussed the staging and pathophysiology of breast cancer. We discussed all of the different options for treatment for breast cancer including surgery, chemotherapy, radiation therapy, Herceptin, and antiestrogen therapy. We discussed a sentinel lymph node biopsy as she does not appear to having lymph node involvement right now. We discussed the performance of that with injection of radioactive tracer and blue dye. We discussed that she would have an incision underneath her axillary hairline. We discussed that there is a bout a 10-20% chance of having a positive node with a sentinel lymph node biopsy and we will await the permanent pathology to make any other first further decisions in terms of her treatment. One of these options might be to return to the operating room to perform an axillary lymph node dissection. We discussed about a 1-2% risk lifetime of chronic shoulder pain as well as lymphedema associated with a sentinel lymph node biopsy. We discussed the options for treatment of the breast cancer which included lumpectomy versus a mastectomy. We  discussed the performance of the lumpectomy with a wire placement. We discussed a 10-20% chance of a positive margin requiring reexcision in the operating room. We also discussed that she may need radiation therapy or antiestrogen therapy or both if she undergoes lumpectomy. We discussed the mastectomy and the postoperative care for that as well. We discussed that there is no difference in her survival whether she undergoes lumpectomy with radiation therapy or antiestrogen therapy versus a mastectomy. There is a slight difference in the local recurrence rate being 3-5% with lumpectomy and about 1% with a mastectomy. We discussed the risks of operation including bleeding, infection, possible reoperation. She understands her further therapy will be based on what her stages at the time of her operation.  Pt Education - [facility] Mastectomy HCI

## 2016-01-06 ENCOUNTER — Ambulatory Visit: Payer: Self-pay | Admitting: Surgery

## 2016-01-14 ENCOUNTER — Encounter: Payer: Self-pay | Admitting: *Deleted

## 2016-01-14 ENCOUNTER — Telehealth: Payer: Self-pay | Admitting: Oncology

## 2016-01-14 NOTE — Telephone Encounter (Signed)
lmom to inform patient of appt change 6/1 to 7/25 at 11 am

## 2016-02-10 ENCOUNTER — Ambulatory Visit: Payer: 59 | Admitting: Oncology

## 2016-02-10 DIAGNOSIS — Z853 Personal history of malignant neoplasm of breast: Secondary | ICD-10-CM

## 2016-02-10 HISTORY — DX: Personal history of malignant neoplasm of breast: Z85.3

## 2016-02-28 NOTE — H&P (Signed)
  Subjective:    Patient ID: Belinda White is a 45 y.o. female.  HPI  Plan bilateral breast reconstruction with expanders possible acellular dermis. Presented following screening MMG demonstrating in the upper outer quadrant of the right breast pleomorphic calcifications spanning 6 cm. Diagnostic MMG confirmedlarge area of suspicious calcifications involving both lower quadrants. [place] with negative axilla. Biopsy of 2 areas of calcifications, both in the lower quadrants, showed DCIS, ER/PR +. She was started on tamoxifen neoadjuvantly. Mastectomy has been recommended given extent disease. Plan NSM with Dr. Gilford Silvius negative/  History significant for augmentation saline submuscular 2012 with Dr. Nathanial Rancher. Current DD, happy with this. Prior to augmentation A/B cup.  Wt stable.  Review of Systems 12 point review negative    Objective:   Physical Exam  Cardiovascular: Normal rate. Normal heart sounds Pulmonary/Chest: Effort normal. Clear to auscultation Abdominal: Soft.  Genitourinary: No breast discharge.  Lymphadenopathy:  She has no axillary adenopathy.    Bilateral IMF scars though both migrated only lower pole breasts Soft, no contracture   SN to nipple R 21.5 : 22.5 cm BW R 13 L 13 cm Nipple to IMF R 11 L 10 cm  Assessment:     R breast DCIS S/p augmentation mammaplasty    Plan:     Signed ROI to obtain implant information. She would like to put off surgery until 5.26.17 or later as her kids will be out of school then if possible.   Reviewed implant based vs autologous reconstruction. Reviewed TRAM vs DIEP, if she pursues autologous and is bilateral, recommend consultation with DIEP surgeon. Reviewed implant based reconstruction and risks including contracture, infection requiring replacement implant or removal, rupture. Discussed NSM with IMF scars, will be longer than current. NAC and breast will be asensate, not stimulate. Reviewed that I recommend  placement expander over direct to implant reconstruction. Reviewed process expansion, future surgery to replace implant. Reviewed that likely her implant is not completely submuscular but dual plane. Discussed use acellular dermis or serratus for coverage of lower outer pole implant in reconstruction. Discussed process expansion, hospital stay, drains, post procedure limitations and visits. Counseled it is very likely reconstructed breast will be smaller volume, may not achieve DD again. Reviewed risk mastectomy flap necrosis including nipple, need for additional surgery pending margins. Discussed data published at this time indicated similar risk recurrence cancer with NSM vs SSM. 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 Reviewed portfolio pictures and expander examples. At this time she would like to pursue implant based reconstruction, is ok with placement expander rather than implant. Patient desires bilateral procedure.   Patient has Mentor Smooth Round Moderate Profile, Style 1600, 375 ml implants RIGHT fil 375 ml, LEFT fill 390 ml  Irene Limbo, MD [facility]astic [facility] [phone], pin [phone]

## 2016-02-29 ENCOUNTER — Encounter (HOSPITAL_BASED_OUTPATIENT_CLINIC_OR_DEPARTMENT_OTHER): Payer: Self-pay | Admitting: *Deleted

## 2016-03-07 ENCOUNTER — Ambulatory Visit (HOSPITAL_BASED_OUTPATIENT_CLINIC_OR_DEPARTMENT_OTHER): Payer: 59 | Admitting: Anesthesiology

## 2016-03-07 ENCOUNTER — Encounter (HOSPITAL_COMMUNITY)
Admission: RE | Admit: 2016-03-07 | Discharge: 2016-03-07 | Disposition: A | Discharge status: Home / Self Care | Payer: 59 | Source: Ambulatory Visit | Attending: Surgery | Admitting: Surgery

## 2016-03-07 ENCOUNTER — Encounter (HOSPITAL_BASED_OUTPATIENT_CLINIC_OR_DEPARTMENT_OTHER): Payer: Self-pay | Admitting: *Deleted

## 2016-03-07 ENCOUNTER — Encounter (HOSPITAL_BASED_OUTPATIENT_CLINIC_OR_DEPARTMENT_OTHER)
Admission: RE | Disposition: A | Discharge status: Home / Self Care | Payer: Self-pay | Source: Ambulatory Visit | Attending: Surgery

## 2016-03-07 ENCOUNTER — Ambulatory Visit (HOSPITAL_BASED_OUTPATIENT_CLINIC_OR_DEPARTMENT_OTHER)
Admission: RE | Admit: 2016-03-07 | Discharge: 2016-03-08 | Disposition: A | Discharge status: Home / Self Care | Payer: 59 | Source: Ambulatory Visit | Attending: Surgery | Admitting: Surgery

## 2016-03-07 DIAGNOSIS — Z79810 Long term (current) use of selective estrogen receptor modulators (SERMs): Secondary | ICD-10-CM | POA: Diagnosis not present

## 2016-03-07 DIAGNOSIS — N6022 Fibroadenosis of left breast: Secondary | ICD-10-CM | POA: Insufficient documentation

## 2016-03-07 DIAGNOSIS — N6091 Unspecified benign mammary dysplasia of right breast: Secondary | ICD-10-CM | POA: Diagnosis not present

## 2016-03-07 DIAGNOSIS — D0511 Intraductal carcinoma in situ of right breast: Secondary | ICD-10-CM | POA: Diagnosis present

## 2016-03-07 DIAGNOSIS — N6092 Unspecified benign mammary dysplasia of left breast: Secondary | ICD-10-CM | POA: Diagnosis not present

## 2016-03-07 DIAGNOSIS — D242 Benign neoplasm of left breast: Secondary | ICD-10-CM | POA: Diagnosis not present

## 2016-03-07 DIAGNOSIS — N6021 Fibroadenosis of right breast: Secondary | ICD-10-CM | POA: Diagnosis not present

## 2016-03-07 DIAGNOSIS — Z803 Family history of malignant neoplasm of breast: Secondary | ICD-10-CM | POA: Diagnosis not present

## 2016-03-07 DIAGNOSIS — Z4001 Encounter for prophylactic removal of breast: Secondary | ICD-10-CM | POA: Diagnosis not present

## 2016-03-07 HISTORY — DX: Other specified postprocedural states: Z98.890

## 2016-03-07 HISTORY — DX: Adverse effect of unspecified anesthetic, initial encounter: T41.45XA

## 2016-03-07 HISTORY — DX: Nausea with vomiting, unspecified: R11.2

## 2016-03-07 HISTORY — DX: Other complications of anesthesia, initial encounter: T88.59XA

## 2016-03-07 HISTORY — PX: NIPPLE SPARING MASTECTOMY/SENTINAL LYMPH NODE BIOPSY/RECONSTRUCTION/PLACEMENT OF TISSUE EXPANDER: SHX6484

## 2016-03-07 HISTORY — PX: BREAST RECONSTRUCTION WITH PLACEMENT OF TISSUE EXPANDER AND FLEX HD (ACELLULAR HYDRATED DERMIS): SHX6295

## 2016-03-07 HISTORY — PX: BREAST IMPLANT REMOVAL: SHX5361

## 2016-03-07 SURGERY — NIPPLE SPARING MASTECTOMY WITH SENTINAL LYMPH NODE BIOPSY AND  RECONSTRUCTION WITH PLACEMENT OF TISSUE EXPANDER
Anesthesia: General | Site: Breast | Laterality: Left

## 2016-03-07 SURGERY — Surgical Case
Anesthesia: *Unknown

## 2016-03-07 MED ORDER — TECHNETIUM TC 99M SULFUR COLLOID FILTERED
1.0000 | Freq: Once | INTRAVENOUS | Status: AC | PRN
  Administered 2016-03-07: 1 via INTRADERMAL

## 2016-03-07 MED ORDER — KETOROLAC TROMETHAMINE 30 MG/ML IJ SOLN
INTRAMUSCULAR | Status: AC
Start: 2016-03-07 — End: 2016-03-07
  Filled 2016-03-07: qty 1

## 2016-03-07 MED ORDER — ONDANSETRON 4 MG PO TBDP: 4.0000 mg | ORAL_TABLET | Freq: Four times a day (QID) | ORAL | Status: DC | PRN

## 2016-03-07 MED ORDER — FENTANYL CITRATE (PF) 100 MCG/2ML IJ SOLN
INTRAMUSCULAR | Status: AC
  Filled 2016-03-07: qty 2

## 2016-03-07 MED ORDER — PHENYLEPHRINE HCL 10 MG/ML IJ SOLN
INTRAMUSCULAR | Status: DC | PRN
  Administered 2016-03-07: 80 ug via INTRAVENOUS
  Administered 2016-03-07: 40 ug via INTRAVENOUS
  Administered 2016-03-07: 80 ug via INTRAVENOUS

## 2016-03-07 MED ORDER — FENTANYL CITRATE (PF) 100 MCG/2ML IJ SOLN
INTRAMUSCULAR | Status: DC | PRN
  Administered 2016-03-07 (×4): 25 ug via INTRAVENOUS
  Administered 2016-03-07: 100 ug via INTRAVENOUS

## 2016-03-07 MED ORDER — SODIUM CHLORIDE 0.9 % IV SOLN
INTRAVENOUS | Status: DC | PRN
  Administered 2016-03-07: 1000 mL

## 2016-03-07 MED ORDER — SCOPOLAMINE 1 MG/3DAYS TD PT72
1.0000 | MEDICATED_PATCH | Freq: Once | TRANSDERMAL | Status: DC | PRN
  Administered 2016-03-07: 1.5 mg via TRANSDERMAL

## 2016-03-07 MED ORDER — PROPOFOL 10 MG/ML IV BOLUS
INTRAVENOUS | Status: DC | PRN
  Administered 2016-03-07: 50 mg via INTRAVENOUS
  Administered 2016-03-07: 200 mg via INTRAVENOUS
  Administered 2016-03-07: 50 mg via INTRAVENOUS

## 2016-03-07 MED ORDER — ESMOLOL HCL 100 MG/10ML IV SOLN
INTRAVENOUS | Status: DC | PRN
  Administered 2016-03-07 (×2): 10 mg via INTRAVENOUS

## 2016-03-07 MED ORDER — HYDROMORPHONE HCL 1 MG/ML IJ SOLN
INTRAMUSCULAR | Status: AC
  Filled 2016-03-07: qty 1

## 2016-03-07 MED ORDER — PROMETHAZINE HCL 25 MG/ML IJ SOLN
INTRAMUSCULAR | Status: AC
  Filled 2016-03-07: qty 1

## 2016-03-07 MED ORDER — MIDAZOLAM HCL 2 MG/2ML IJ SOLN
1.0000 mg | INTRAMUSCULAR | Status: DC | PRN
  Administered 2016-03-07: 2 mg via INTRAVENOUS

## 2016-03-07 MED ORDER — METHOCARBAMOL 500 MG PO TABS
500.0000 mg | ORAL_TABLET | Freq: Three times a day (TID) | ORAL | Status: DC | PRN
  Administered 2016-03-07: 500 mg via ORAL
  Filled 2016-03-07: qty 1

## 2016-03-07 MED ORDER — LIDOCAINE HCL (CARDIAC) 20 MG/ML IV SOLN
INTRAVENOUS | Status: DC | PRN
  Administered 2016-03-07: 50 mg via INTRAVENOUS

## 2016-03-07 MED ORDER — METHYLENE BLUE 0.5 % INJ SOLN
INTRAVENOUS | Status: AC
Start: 2016-03-07 — End: 2016-03-07
  Filled 2016-03-07: qty 10

## 2016-03-07 MED ORDER — KETOROLAC TROMETHAMINE 30 MG/ML IJ SOLN
30.0000 mg | Freq: Three times a day (TID) | INTRAMUSCULAR | Status: AC
  Administered 2016-03-07 – 2016-03-08 (×3): 30 mg via INTRAVENOUS

## 2016-03-07 MED ORDER — MORPHINE SULFATE (PF) 10 MG/ML IV SOLN
INTRAVENOUS | Status: AC
  Filled 2016-03-07: qty 1

## 2016-03-07 MED ORDER — ONDANSETRON HCL 4 MG/2ML IJ SOLN
INTRAMUSCULAR | Status: DC | PRN
  Administered 2016-03-07: 4 mg via INTRAVENOUS

## 2016-03-07 MED ORDER — PROMETHAZINE HCL 25 MG/ML IJ SOLN
12.5000 mg | Freq: Four times a day (QID) | INTRAMUSCULAR | Status: DC | PRN
  Administered 2016-03-07: 12.5 mg via INTRAVENOUS

## 2016-03-07 MED ORDER — MIDAZOLAM HCL 2 MG/2ML IJ SOLN
INTRAMUSCULAR | Status: AC
  Filled 2016-03-07: qty 2

## 2016-03-07 MED ORDER — PHENYLEPHRINE 40 MCG/ML (10ML) SYRINGE FOR IV PUSH (FOR BLOOD PRESSURE SUPPORT)
PREFILLED_SYRINGE | INTRAVENOUS | Status: AC
  Filled 2016-03-07: qty 10

## 2016-03-07 MED ORDER — BUPIVACAINE-EPINEPHRINE (PF) 0.25% -1:200000 IJ SOLN
INTRAMUSCULAR | Status: AC
  Filled 2016-03-07: qty 30

## 2016-03-07 MED ORDER — DEXAMETHASONE SODIUM PHOSPHATE 4 MG/ML IJ SOLN
INTRAMUSCULAR | Status: DC | PRN
  Administered 2016-03-07: 10 mg via INTRAVENOUS

## 2016-03-07 MED ORDER — TAMOXIFEN CITRATE 20 MG PO TABS
20.0000 mg | ORAL_TABLET | Freq: Every day | ORAL | Status: DC
Start: 2016-03-07 — End: 2016-03-08

## 2016-03-07 MED ORDER — GLYCOPYRROLATE 0.2 MG/ML IJ SOLN: 0.2000 mg | Freq: Once | INTRAMUSCULAR | Status: DC | PRN

## 2016-03-07 MED ORDER — ESMOLOL HCL 100 MG/10ML IV SOLN
INTRAVENOUS | Status: AC
  Filled 2016-03-07: qty 10

## 2016-03-07 MED ORDER — MEPERIDINE HCL 25 MG/ML IJ SOLN
6.2500 mg | INTRAMUSCULAR | Status: DC | PRN
Start: 2016-03-07 — End: 2016-03-08

## 2016-03-07 MED ORDER — DEXAMETHASONE SODIUM PHOSPHATE 10 MG/ML IJ SOLN
INTRAMUSCULAR | Status: AC
  Filled 2016-03-07: qty 1

## 2016-03-07 MED ORDER — SODIUM CHLORIDE 0.9 % IJ SOLN
INTRAMUSCULAR | Status: AC
  Filled 2016-03-07: qty 10

## 2016-03-07 MED ORDER — LACTATED RINGERS IV SOLN
INTRAVENOUS | Status: DC
  Administered 2016-03-07 (×3): via INTRAVENOUS

## 2016-03-07 MED ORDER — FENTANYL CITRATE (PF) 100 MCG/2ML IJ SOLN
50.0000 ug | INTRAMUSCULAR | Status: DC | PRN
  Administered 2016-03-07: 100 ug via INTRAVENOUS

## 2016-03-07 MED ORDER — OXYCODONE HCL 5 MG PO TABS
5.0000 mg | ORAL_TABLET | ORAL | Status: DC | PRN
  Administered 2016-03-07: 10 mg via ORAL
  Administered 2016-03-07: 5 mg via ORAL
  Administered 2016-03-08: 10 mg via ORAL
  Filled 2016-03-07: qty 1
  Filled 2016-03-07 (×2): qty 2

## 2016-03-07 MED ORDER — HYDROMORPHONE HCL 1 MG/ML IJ SOLN
0.5000 mg | INTRAMUSCULAR | Status: DC | PRN
  Administered 2016-03-07: 1 mg via INTRAVENOUS
  Filled 2016-03-07: qty 1

## 2016-03-07 MED ORDER — DEXTROSE 5 % IV SOLN
3.0000 g | INTRAVENOUS | Status: AC
  Administered 2016-03-07: 2 g via INTRAVENOUS

## 2016-03-07 MED ORDER — MORPHINE SULFATE 10 MG/ML IJ SOLN
INTRAMUSCULAR | Status: DC | PRN
  Administered 2016-03-07: 4 mg via INTRAVENOUS

## 2016-03-07 MED ORDER — ONDANSETRON HCL 4 MG/2ML IJ SOLN
4.0000 mg | Freq: Four times a day (QID) | INTRAMUSCULAR | Status: DC | PRN
  Administered 2016-03-07: 4 mg via INTRAVENOUS
  Filled 2016-03-07: qty 2

## 2016-03-07 MED ORDER — KCL IN DEXTROSE-NACL 20-5-0.45 MEQ/L-%-% IV SOLN
INTRAVENOUS | Status: DC
  Administered 2016-03-07: 15:00:00 via INTRAVENOUS
  Filled 2016-03-07: qty 1000

## 2016-03-07 MED ORDER — PROPOFOL 10 MG/ML IV BOLUS
INTRAVENOUS | Status: AC
  Filled 2016-03-07: qty 40

## 2016-03-07 MED ORDER — LIDOCAINE 2% (20 MG/ML) 5 ML SYRINGE
INTRAMUSCULAR | Status: AC
  Filled 2016-03-07: qty 5

## 2016-03-07 MED ORDER — ONDANSETRON HCL 4 MG/2ML IJ SOLN
INTRAMUSCULAR | Status: AC
  Filled 2016-03-07: qty 2

## 2016-03-07 MED ORDER — HYDROMORPHONE HCL 1 MG/ML IJ SOLN
0.2500 mg | INTRAMUSCULAR | Status: DC | PRN
  Administered 2016-03-07 (×3): 0.5 mg via INTRAVENOUS

## 2016-03-07 MED ORDER — OXYCODONE HCL 5 MG PO TABS: 5.0000 mg | ORAL_TABLET | Freq: Once | ORAL | Status: DC | PRN

## 2016-03-07 MED ORDER — CEFAZOLIN SODIUM-DEXTROSE 2-4 GM/100ML-% IV SOLN
INTRAVENOUS | Status: AC
  Filled 2016-03-07: qty 100

## 2016-03-07 MED ORDER — SCOPOLAMINE 1 MG/3DAYS TD PT72
MEDICATED_PATCH | TRANSDERMAL | Status: AC
  Filled 2016-03-07: qty 1

## 2016-03-07 MED ORDER — KETOROLAC TROMETHAMINE 15 MG/ML IJ SOLN
INTRAMUSCULAR | Status: AC
  Filled 2016-03-07: qty 2

## 2016-03-07 MED ORDER — SUCCINYLCHOLINE CHLORIDE 200 MG/10ML IV SOSY
PREFILLED_SYRINGE | INTRAVENOUS | Status: AC
Start: 2016-03-07 — End: 2016-03-07
  Filled 2016-03-07: qty 10

## 2016-03-07 MED ORDER — SUCCINYLCHOLINE CHLORIDE 20 MG/ML IJ SOLN
INTRAMUSCULAR | Status: DC | PRN
  Administered 2016-03-07: 50 mg via INTRAVENOUS

## 2016-03-07 MED ORDER — CEFAZOLIN IN D5W 1 GM/50ML IV SOLN
1.0000 g | Freq: Three times a day (TID) | INTRAVENOUS | Status: AC
  Administered 2016-03-07 – 2016-03-08 (×3): 1 g via INTRAVENOUS
  Filled 2016-03-07 (×3): qty 50

## 2016-03-07 MED ORDER — MIDAZOLAM HCL 5 MG/5ML IJ SOLN
INTRAMUSCULAR | Status: DC | PRN
  Administered 2016-03-07: 2 mg via INTRAVENOUS

## 2016-03-07 MED ORDER — OXYCODONE HCL 5 MG/5ML PO SOLN: 5.0000 mg | Freq: Once | ORAL | Status: DC | PRN

## 2016-03-07 SURGICAL SUPPLY — 80 items
APPLIER CLIP 9.375 MED OPEN (MISCELLANEOUS) ×4
BAG DECANTER FOR FLEXI CONT (MISCELLANEOUS) ×4 IMPLANT
BINDER BREAST LRG (GAUZE/BANDAGES/DRESSINGS) IMPLANT
BINDER BREAST MEDIUM (GAUZE/BANDAGES/DRESSINGS) IMPLANT
BLADE HEX COATED 2.75 (ELECTRODE) ×4 IMPLANT
BLADE SURG 10 STRL SS (BLADE) ×4 IMPLANT
BLADE SURG 15 STRL LF DISP TIS (BLADE) ×2 IMPLANT
BLADE SURG 15 STRL SS (BLADE) ×2
BNDG GAUZE ELAST 4 BULKY (GAUZE/BANDAGES/DRESSINGS) ×8 IMPLANT
CANISTER SUCT 1200ML W/VALVE (MISCELLANEOUS) ×4 IMPLANT
CHLORAPREP W/TINT 26ML (MISCELLANEOUS) ×8 IMPLANT
CLIP APPLIE 9.375 MED OPEN (MISCELLANEOUS) ×2 IMPLANT
COVER BACK TABLE 60X90IN (DRAPES) ×4 IMPLANT
COVER MAYO STAND STRL (DRAPES) ×4 IMPLANT
COVER PROBE W GEL 5X96 (DRAPES) ×4 IMPLANT
COVER SURGICAL LIGHT HANDLE (MISCELLANEOUS) ×8 IMPLANT
DRAIN CHANNEL 19F RND (DRAIN) ×8 IMPLANT
DRAPE INCISE IOBAN 66X45 STRL (DRAPES) ×4 IMPLANT
DRAPE LAPAROSCOPIC ABDOMINAL (DRAPES) ×4 IMPLANT
DRAPE U-SHAPE 76X120 STRL (DRAPES) ×8 IMPLANT
DRAPE UTILITY XL STRL (DRAPES) ×4 IMPLANT
DRSG PAD ABDOMINAL 8X10 ST (GAUZE/BANDAGES/DRESSINGS) ×16 IMPLANT
DRSG TEGADERM 4X10 (GAUZE/BANDAGES/DRESSINGS) ×12 IMPLANT
DRSG TEGADERM 4X4.75 (GAUZE/BANDAGES/DRESSINGS) IMPLANT
ELECT BLADE 4.0 EZ CLEAN MEGAD (MISCELLANEOUS) ×4
ELECT COATED BLADE 2.86 ST (ELECTRODE) ×4 IMPLANT
ELECT REM PT RETURN 9FT ADLT (ELECTROSURGICAL) ×4
ELECTRODE BLDE 4.0 EZ CLN MEGD (MISCELLANEOUS) ×2 IMPLANT
ELECTRODE REM PT RTRN 9FT ADLT (ELECTROSURGICAL) ×2 IMPLANT
EVACUATOR SILICONE 100CC (DRAIN) ×8 IMPLANT
EXPANDER TISSUE MX 400CC (Breast) ×4 IMPLANT
GAUZE SPONGE 4X4 12PLY STRL (GAUZE/BANDAGES/DRESSINGS) ×4 IMPLANT
GLOVE BIO SURGEON STRL SZ 6 (GLOVE) ×16 IMPLANT
GLOVE BIOGEL PI IND STRL 7.0 (GLOVE) ×8 IMPLANT
GLOVE BIOGEL PI IND STRL 7.5 (GLOVE) ×4 IMPLANT
GLOVE BIOGEL PI IND STRL 8 (GLOVE) ×2 IMPLANT
GLOVE BIOGEL PI INDICATOR 7.0 (GLOVE) ×8
GLOVE BIOGEL PI INDICATOR 7.5 (GLOVE) ×4
GLOVE BIOGEL PI INDICATOR 8 (GLOVE) ×2
GLOVE ECLIPSE 6.5 STRL STRAW (GLOVE) ×16 IMPLANT
GLOVE ECLIPSE 8.0 STRL XLNG CF (GLOVE) ×4 IMPLANT
GLOVE SURG SS PI 6.5 STRL IVOR (GLOVE) ×4 IMPLANT
GLOVE SURG SS PI 7.5 STRL IVOR (GLOVE) ×8 IMPLANT
GOWN STRL REUS W/ TWL LRG LVL3 (GOWN DISPOSABLE) ×16 IMPLANT
GOWN STRL REUS W/TWL LRG LVL3 (GOWN DISPOSABLE) ×16
IV NS 500ML (IV SOLUTION) ×4
IV NS 500ML BAXH (IV SOLUTION) ×4 IMPLANT
KIT FILL SYSTEM UNIVERSAL (SET/KITS/TRAYS/PACK) ×4 IMPLANT
KIT MARKER MARGIN INK (KITS) ×8 IMPLANT
LIGHT WAVEGUIDE WIDE FLAT (MISCELLANEOUS) ×4 IMPLANT
LIQUID BAND (GAUZE/BANDAGES/DRESSINGS) ×8 IMPLANT
MARKER SKIN DUAL TIP RULER LAB (MISCELLANEOUS) ×4 IMPLANT
PACK BASIN DAY SURGERY FS (CUSTOM PROCEDURE TRAY) ×4 IMPLANT
PENCIL BUTTON HOLSTER BLD 10FT (ELECTRODE) ×4 IMPLANT
PIN SAFETY STERILE (MISCELLANEOUS) ×4 IMPLANT
SHEET MEDIUM DRAPE 40X70 STRL (DRAPES) ×4 IMPLANT
SLEEVE SCD COMPRESS KNEE MED (MISCELLANEOUS) ×4 IMPLANT
SLEEVE SURGEON STRL (DRAPES) ×4 IMPLANT
SPONGE GAUZE 4X4 12PLY STER LF (GAUZE/BANDAGES/DRESSINGS) ×4 IMPLANT
SPONGE LAP 18X18 X RAY DECT (DISPOSABLE) ×12 IMPLANT
STAPLER VISISTAT 35W (STAPLE) ×4 IMPLANT
SUT CHROMIC 5 0 P 3 (SUTURE) ×4 IMPLANT
SUT ETHILON 2 0 FS 18 (SUTURE) ×8 IMPLANT
SUT MNCRL AB 4-0 PS2 18 (SUTURE) ×8 IMPLANT
SUT MON AB 4-0 PC3 18 (SUTURE) ×4 IMPLANT
SUT PDS AB 2-0 CT2 27 (SUTURE) ×12 IMPLANT
SUT VIC AB 3-0 SH 27 (SUTURE) ×8
SUT VIC AB 3-0 SH 27X BRD (SUTURE) ×8 IMPLANT
SUT VICRYL 3-0 CR8 SH (SUTURE) ×4 IMPLANT
SUT VICRYL 4-0 PS2 18IN ABS (SUTURE) ×8 IMPLANT
SYR BULB IRRIGATION 50ML (SYRINGE) ×8 IMPLANT
TAPE STRIPS DRAPE STRL (GAUZE/BANDAGES/DRESSINGS) ×4 IMPLANT
TISSUE EXPANDER MX 400CC (Breast) ×8 IMPLANT
TOWEL OR 17X24 6PK STRL BLUE (TOWEL DISPOSABLE) ×16 IMPLANT
TOWEL OR NON WOVEN STRL DISP B (DISPOSABLE) ×4 IMPLANT
TRAY FOLEY CATH SILVER 16FR (SET/KITS/TRAYS/PACK) ×4 IMPLANT
TUBE CONNECTING 20'X1/4 (TUBING) ×1
TUBE CONNECTING 20X1/4 (TUBING) ×3 IMPLANT
UNDERPAD 30X30 (UNDERPADS AND DIAPERS) ×8 IMPLANT
YANKAUER SUCT BULB TIP NO VENT (SUCTIONS) ×8 IMPLANT

## 2016-03-07 NOTE — Anesthesia Postprocedure Evaluation (Signed)
Anesthesia Post Note  Patient: Belinda White  Procedure(s) Performed: Procedure(s) (LRB): LEFT PROPHYLACTIC NIPPLE SPARING MASTECTOMY AND RIGHT  NIPPLE SPARING MASTECTOMY WITH RIGHT SENTINAL LYMPH NODE BIOPSY  (Bilateral) BILATERAL BREAST RECONSTRUCTION WITH PLACEMENT OF TISSUE EXPANDER  (Bilateral) REMOVAL BREAST IMPLANTS (Bilateral)  Patient location during evaluation: PACU Anesthesia Type: General Level of consciousness: awake and alert and patient cooperative Pain management: pain level controlled Vital Signs Assessment: post-procedure vital signs reviewed and stable Respiratory status: spontaneous breathing and respiratory function stable Cardiovascular status: stable Anesthetic complications: no    Last Vitals:  Filed Vitals:   03/07/16 1415 03/07/16 1445  BP: 113/56 116/70  Pulse: 85 95  Temp:  37.1 C  Resp: 14 16    Last Pain:  Filed Vitals:   03/07/16 1447  PainSc: [place]

## 2016-03-07 NOTE — Op Note (Signed)
Operative Note   DATE OF OPERATION: 6.27.17  LOCATION: [facility]-observation  SURGICAL DIVISION: Plastic Surgery  PREOPERATIVE DIAGNOSES:  1. Right breast DCIS 2, History augmentation mammaplasty  POSTOPERATIVE DIAGNOSES:  same  PROCEDURE:  1. Bilateral breast reconstruction with tissue expanders  SURGEON: Irene Limbo MD MBA  ASSISTANT: none  ANESTHESIA:  General.   EBL: 80 ml for entire case  COMPLICATIONS: None immediate.   INDICATIONS FOR PROCEDURE:  The patient, Belinda White, is a 45 y.o. female born on 12/24/1970, is here for immediate reconstruction following nipple sparing mastectomies. She has undergone prior augmentation dual plane with saline implants.   FINDINGS: Removed intact saline implants. Natrelle 133MX-12-T 400 ml tissue expanders placed bilateral, initial fill volume 200 ml. RIGHT SN [id number] LEFT [id number]  DESCRIPTION OF PROCEDURE:  The patient's operative site was marked with the patient in the preoperative area including sternal notch, chest midline, anterior axillary lines. Patient had bilateral IMF scars from augmentation that had migrated onto breast mound. She demonstrated asymmetry of the inframammary folds with right being lower. The IMF decisions were designed symmetric on chest wall; this incorporated prior IMF scar on right and was located below this scar on left. The patient was taken to the operating room. SCDs were placed and IV antibiotics were given. Foley catheter placed.The patient's operative site was prepped and draped in a sterile fashion. A time out was performed and all information was confirmed to be correct. Following completion of mastectomies, reconstruction began on left side. The pectoralis major muscle was elevated with prior augmentation. The serratus fascia and muscle were elevated laterally. A 19 Fr drain was placed in subcutaneous position laterally and submuscular position medialy and secured to skin with 2-0 nylon.  The cavity was irrigated with solution containing Ancef, genatmicin, and bacitracin. Hemostasis was ensured. The inframammary fold had been disrupted from prior surgery and some bottoming out of implant; the IMF was reset with interrupted 2-0 PDS from superficial fascia and capsule to chest wall. Cavity irrigated with Betadine. The tissue expander was prepared and placed in submuscular position. The expander was secured to chest wall with a 3-0 vicryl. The pectoralis was secured to elevated serratus fascia and muscle with interrupted figure of eight 3-0 vicryl and 2-0 PDS. The incision was closed with 3-0 vicryl in fascial layer and 4-0 vicryl in dermis. Skin closure completed with 4-0 monocryl subcuticular and tissue adhesive. I then directed my attention to right chest where again the serratus muscle was elevated. The IMF was reset with interrupted 2-0 PDS from superficial fascia and capsule to chest wall. Following irrigation and drain placement, tissue expander placed and secured to chest wall. The pectoralis was secured to elevated serratus fascia and muscle with interrupted figure of eight 3-0 vicryl and 2-0 PDS. Closure completed in similar fashion. The ports was accessed and filled to 200 ml. The patient was brought to upright position and the skin flaps were redraped so that NAC was symmetric from the sternal notch and midline. Transparent, adherent dressings applied. Dry dressing and breast binder applied.  The patient was allowed to wake from anesthesia, extubated and taken to the recovery room in satisfactory condition.   SPECIMENS: none  DRAINS: 19 Fr JP in right and left chest  Irene Limbo, MD [address] [facility] Plastic & Reconstructive Surgery [phone], pin [phone]

## 2016-03-07 NOTE — H&P (Signed)
H&P   Belinda White (MR# [id number])      H&P Info    Author Note Status Last Update User Last Update Date/Time   Erroll Luna, MD Signed Erroll Luna, MD     H&P    Expand All Collapse All   Belinda White  Location: [facility] Patient #: [id number] DOB: Nov 10, 1970 Married / Language: Cleophus Molt / Race: White Female  History of Present Illness Belinda Moores A. Samanda Buske MD;  Patient words: atient words: patient sent at the request of Dr. Pablo Ledger for abnormal mammogram. She has a history of bilateral saline implants. On her most recenomorphic calcification she had a 10 cm area of pleomorphic calcifications involving the right breast. Core biopsy showed DCIS. She was seen today in the multidisciplinary clinic. She has a family history of breast cancer. Denies any history of breast pain, nipple discharge or masses. Her implants have been in at least 5 years she thinks.    Patient returns after seeing Dr. Carol Ada and getting her genetic testing which is negative. She has interest in bilateral nipple sparing mastectomies. She has implants and all ready and may have to have these removed. I discussed with her the rationale for right simple mastectomy. I discussed that there is no survival benefit to bilateral mastectomies after reviewing the literature but given her young age she would have a lifetime risk of a new breast cancer in the left breast of well over 30%.  The patient is a 46 year old female.   Allergies  No Known Drug Allergies 12/31/2015  Medication History  Tamoxifen Citrate (20MG  Tablet, Oral) Active. Medications Reconciled    Vitals  12/31/2015 9:11 AM Weight: 145 lb Height: 66in Body Surface Area: 1.74 m Body Mass Index: 23.4 kg/m  Temp.: 33F(Temporal)  Pulse: 76 (Regular)  BP: 128/80 (Sitting, Left Arm, Standard)      Physical Exam   General Mental Status-Alert. General Appearance-Consistent with stated  age. Hydration-Well hydrated. Voice-Normal.  Breast inferior mammary scars noted  Implants in place  Breast - Left-Symmetric, Non Tender, No Biopsy scars, no Dimpling, No Inflammation, No Lumpectomy scars, No Mastectomy scars, No Peau d' Orange. Breast - Right-Symmetric, Non Tender, No Biopsy scars, no Dimpling, No Inflammation, No Lumpectomy scars, No Mastectomy scars, No Peau d' Orange. Breast Lump-No Palpable Breast Mass.  Cardiovascular Cardiovascular examination reveals -normal heart sounds, regular rate and rhythm with no murmurs and normal pedal pulses bilaterally.  Lymphatic Head & Neck  General Head & Neck Lymphatics: Bilateral - Description - Normal. Axillary  General Axillary Region: Bilateral - Description - Normal. Tenderness - Non Tender.    Assessment & Plan   BREAST NEOPLASM, TIS (DCIS), RIGHT (D05.11) Impression: pt has seen genetics and is negative. Given her young age she has a lifetime risk of over 30 % for a new breast cancer on the left  she is leaning toward bilateral mastectomy with left prophylactic due to lifetime risk  she will let me know  she has seen Dr Iran Planas  Would proceed with NSM in her case with right SLN mapping  discussed options of simple mastectomy versus nipple preservation withplastic surgery versus delayed reconstruction Discussed sentinel lymph node mapping as well. Risk of sentinel lymph node mapping include bleeding, infection, lymphedema, shoulder pain. stiffness, dye allergy. cosmetic deformity , nipple loss blood clots, death, need for more surgery. Pt agres to proceed. Discussed treatment options for breast cancer to include breast conservation vs mastectomy with reconstruction. Pt has decided on mastectomy.  Risk include bleeding, infection, flap necrosis, pain, numbness, numbness, pain, nipple loss, recurrence, hematoma, other surgery needs. Pt understands and agrees to proceed.   Discussed treatment options for  breast cancer to include breast conservation vs mastectomy with reconstruction. Pt has decided on mastectomy. Risk include bleeding, infection, flap necrosis, pain, numbness, recurrence, hematoma, other surgery needs. Pt understands and agrees to proceed.  Current Plans You are being scheduled for surgery - Our schedulers will call you.  You should hear from our office's scheduling department within 5 working days about the location, date, and time of surgery. We try to make accommodations for patient's preferences in scheduling surgery, but sometimes the OR schedule or the surgeon's schedule prevents [place] from making those accommodations.  If you have not heard from our office [phone] in 5 working days, call the office and ask for your surgeon's nurse.  If you have other questions about your diagnosis, plan, or surgery, call the office and ask for your surgeon's nurse.  We discussed the staging and pathophysiology of breast cancer. We discussed all of the different options for treatment for breast cancer including surgery, chemotherapy, radiation therapy, Herceptin, and antiestrogen therapy. We discussed a sentinel lymph node biopsy as she does not appear to having lymph node involvement right now. We discussed the performance of that with injection of radioactive tracer and blue dye. We discussed that she would have an incision underneath her axillary hairline. We discussed that there is a bout a 10-20% chance of having a positive node with a sentinel lymph node biopsy and we will await the permanent pathology to make any other first further decisions in terms of her treatment. One of these options might be to return to the operating room to perform an axillary lymph node dissection. We discussed about a 1-2% risk lifetime of chronic shoulder pain as well as lymphedema associated with a sentinel lymph node biopsy. We discussed the options for treatment of the breast cancer which included  lumpectomy versus a mastectomy. We discussed the performance of the lumpectomy with a wire placement. We discussed a 10-20% chance of a positive margin requiring reexcision in the operating room. We also discussed that she may need radiation therapy or antiestrogen therapy or both if she undergoes lumpectomy. We discussed the mastectomy and the postoperative care for that as well. We discussed that there is no difference in her survival whether she undergoes lumpectomy with radiation therapy or antiestrogen therapy versus a mastectomy. There is a slight difference in the local recurrence rate being 3-5% with lumpectomy and about 1% with a mastectomy. We discussed the risks of operation including bleeding, infection, possible reoperation. She understands her further therapy will be based on what her stages at the time of her operation.  Pt Education - [facility] Mastectomy HCI

## 2016-03-07 NOTE — Transfer of Care (Signed)
Immediate Anesthesia Transfer of Care Note  Patient: Belinda White  Procedure(s) Performed: Procedure(s): LEFT PROPHYLACTIC NIPPLE SPARING MASTECTOMY AND RIGHT  NIPPLE SPARING MASTECTOMY WITH RIGHT SENTINAL LYMPH NODE BIOPSY  (Bilateral) BILATERAL BREAST RECONSTRUCTION WITH PLACEMENT OF TISSUE EXPANDER  (Bilateral) REMOVAL BREAST IMPLANTS (Bilateral)  Patient Location: PACU  Anesthesia Type:GA combined with regional for post-op pain  Level of Consciousness: awake, alert  and oriented  Airway & Oxygen Therapy: Patient Spontanous Breathing and Patient connected to face mask oxygen  Post-op Assessment: Report given to RN and Post -op Vital signs reviewed and stable  Post vital signs: Reviewed and stable  Last Vitals:  Filed Vitals:   03/07/16 1311 03/07/16 1312  BP: 116/69   Pulse: 94 93  Temp: 36.6 C   Resp: 17 18    Last Pain: There were no vitals filed for this visit.       Complications: No apparent anesthesia complications

## 2016-03-07 NOTE — Anesthesia Procedure Notes (Addendum)
Procedure Name: Intubation Date/Time: 03/07/2016 7:45 AM Performed by: Marrianne Mood Pre-anesthesia Checklist: Patient identified, Emergency Drugs available, Suction available, Patient being monitored and Timeout performed Patient Re-evaluated:Patient Re-evaluated prior to inductionOxygen Delivery Method: Circle system utilized Preoxygenation: Pre-oxygenation with 100% oxygen Intubation Type: IV induction Ventilation: Mask ventilation without difficulty Laryngoscope Size: Mac, 4 and Glidescope Grade View: Grade III Tube type: Oral Tube size: 7.0 mm Number of attempts: 2 Airway Equipment and Method: Stylet and Oral airway Placement Confirmation: ETT inserted through vocal cords under direct vision,  positive ETCO2 and breath sounds checked- equal and bilateral Secured at: 21 cm Tube secured with: Tape Dental Injury: Teeth and Oropharynx as per pre-operative assessment  Difficulty Due To: Difficult Airway- due to dentition and Difficult Airway- due to anterior larynx    Anesthesia Regional Block:  Pectoralis block  Pre-Anesthetic Checklist: ,, timeout performed, Correct Patient, Correct Site, Correct Laterality, Correct Procedure, Correct Position, site marked, Risks and benefits discussed,  Surgical consent,  Pre-op evaluation,  At surgeon's request and post-op pain management  Laterality: Right and Upper  Prep: chloraprep       Needles:  Injection technique: Single-shot  Needle Type: Echogenic Needle     Needle Length: 9cm 9 cm Needle Gauge: 21 and 21 G    Additional Needles:  Procedures: ultrasound guided (picture in chart) Pectoralis block Narrative:  Start time: 03/08/2016 7:15 AM End time: 03/08/2016 7:19 AM Injection made incrementally with aspirations every 5 mL.  Performed by: Personally  Anesthesiologist: Vinson Tietze   Anesthesia Regional Block:  Pectoralis block  Pre-Anesthetic Checklist: ,, timeout performed, Correct Patient, Correct Site, Correct  Laterality, Correct Procedure, Correct Position, site marked, Risks and benefits discussed,  Surgical consent,  Pre-op evaluation,  At surgeon's request and post-op pain management  Laterality: Left and Upper  Prep: chloraprep       Needles:  Injection technique: Single-shot  Needle Type: Echogenic Needle     Needle Length: 9cm 9 cm Needle Gauge: 21 and 21 G    Additional Needles:  Procedures: ultrasound guided (picture in chart) Pectoralis block Narrative:  Start time: 03/08/2016 7:23 AM End time: 03/08/2016 7:27 AM Injection made incrementally with aspirations every 5 mL.  Performed by: Personally  Anesthesiologist: Hawke Villalpando    Right PEC block image   Left PEC block image

## 2016-03-07 NOTE — Anesthesia Preprocedure Evaluation (Signed)
Anesthesia Evaluation    History of Anesthesia Complications (+) PONV  Airway Mallampati: I  TM Distance: >3 FB Neck ROM: Full    Dental  (+) Teeth Intact, Dental Advisory Given   Pulmonary    breath sounds clear to auscultation       Cardiovascular  Rhythm:Regular Rate:Normal     Neuro/Psych    GI/Hepatic   Endo/Other    Renal/GU      Musculoskeletal   Abdominal   Peds  Hematology   Anesthesia Other Findings   Reproductive/Obstetrics                             Anesthesia Physical Anesthesia Plan  ASA: I  Anesthesia Plan: General   Post-op Pain Management:    Induction: Intravenous  Airway Management Planned: Oral ETT  Additional Equipment:   Intra-op Plan:   Post-operative Plan: Extubation in OR  Informed Consent: I have reviewed the patients History and Physical, chart, labs and discussed the procedure including the risks, benefits and alternatives for the proposed anesthesia with the patient or authorized representative who has indicated his/her understanding and acceptance.   Dental advisory given  Plan Discussed with: CRNA, Anesthesiologist and Surgeon  Anesthesia Plan Comments:         Anesthesia Quick Evaluation

## 2016-03-07 NOTE — Brief Op Note (Signed)
03/07/2016  10:36 AM  PATIENT:  Belinda White  45 y.o. female  PRE-OPERATIVE DIAGNOSIS:  RIGHT BREAST CANCER  POST-OPERATIVE DIAGNOSIS:  RIGHT BREAST CANCER  PROCEDURE:  Procedure(s): LEFT PROPHYLACTIC NIPPLE SPARING MASTECTOMY AND RIGHT  NIPPLE SPARING MASTECTOMY WITH RIGHT SENTINAL LYMPH NODE BIOPSY  (Bilateral) BILATERAL BREAST RECONSTRUCTION WITH PLACEMENT OF TISSUE EXPANDER AND POSSIBLE ACELLULAR DERMIS (Bilateral) REMOVAL BREAST IMPLANTS (Bilateral)  SURGEON:  Surgeon(s) and Role: Panel 1:    * Erroll Luna, MD - Primary  Panel 2:    * Irene Limbo, MD - Primary     ANESTHESIA:   general and pectoral block  EBL:  Total I/O In: 2000 [I.V.:2000] Out: 265 [Urine:200; Blood:65]  BLOOD ADMINISTERED:none  DRAINS: see note   LOCAL MEDICATIONS USED:  NONE  SPECIMEN:  Source of Specimen:  bilateral breast tissue and 1 SLN right axilla   DISPOSITION OF SPECIMEN:  PATHOLOGY  COUNTS:  YES  TOURNIQUET:  * No tourniquets in log *  DICTATION: .Other Dictation: Dictation Number   [id number]  PLAN OF CARE: Admit for overnight observation  PATIENT DISPOSITION:  PACU - hemodynamically stable.   Delay start of Pharmacological VTE agent (>24hrs) due to surgical blood loss or risk of bleeding: no

## 2016-03-07 NOTE — Progress Notes (Signed)
Assisted Dr. Al Corpus with right and left, ultrasound guided, pectoralis blocks. Side rails up, monitors on throughout procedure. See vital signs in flow sheet. Tolerated Procedure well.

## 2016-03-07 NOTE — Progress Notes (Signed)
Nuc med staff performed injection. No additional sedation required. Pt tol well (see VS). Will invite family back and update/provide emotional support.

## 2016-03-07 NOTE — Interval H&P Note (Signed)
History and Physical Interval Note:  03/07/2016 6:57 AM  Belinda White  has presented today for surgery, with the diagnosis of RIGHT BREAST CANCER  The various methods of treatment have been discussed with the patient and family. After consideration of risks, benefits and other options for treatment, the patient has consented to  Procedure(s): BILATERAL NIPPLE SPARING MASTECTOMY WITH SENTINAL LYMPH NODE BIOPSY  (Bilateral) RIGHT BREAST RECONSTRUCTION WITH PLACEMENT OF TISSUE EXPANDER AND POSSIBLE ACELLULAR DERMIS (Right) POSSIBLE LEFT BREAST RECONSTRUCTION WITH PLACEMENT OF ACELLULAR DERMIS (Left) as a surgical intervention .  The patient's history has been reviewed, patient examined, no change in status, stable for surgery.  I have reviewed the patient's chart and labs.  Questions were answered to the patient's satisfaction.     [name]

## 2016-03-07 NOTE — Interval H&P Note (Signed)
History and Physical Interval Note:  03/07/2016 7:11 AM  Belinda White  has presented today for surgery, with the diagnosis of RIGHT BREAST CANCER  The various methods of treatment have been discussed with the patient and family. After consideration of risks, benefits and other options for treatment, the patient has consented to  Procedure(s): BILATERAL NIPPLE SPARING MASTECTOMY WITH SENTINAL LYMPH NODE BIOPSY  (Bilateral) RIGHT BREAST RECONSTRUCTION WITH PLACEMENT OF TISSUE EXPANDER AND POSSIBLE ACELLULAR DERMIS (Right) POSSIBLE LEFT BREAST RECONSTRUCTION WITH PLACEMENT OF ACELLULAR DERMIS (Left) as a surgical intervention .  The patient's history has been reviewed, patient examined, no change in status, stable for surgery.  I have reviewed the patient's chart and labs.  Questions were answered to the patient's satisfaction.     Catalina Salasar A.

## 2016-03-08 ENCOUNTER — Encounter (HOSPITAL_BASED_OUTPATIENT_CLINIC_OR_DEPARTMENT_OTHER): Payer: Self-pay | Admitting: Surgery

## 2016-03-08 DIAGNOSIS — D0511 Intraductal carcinoma in situ of right breast: Secondary | ICD-10-CM | POA: Diagnosis not present

## 2016-03-08 MED ORDER — BUPIVACAINE-EPINEPHRINE (PF) 0.5% -1:200000 IJ SOLN
INTRAMUSCULAR | Status: DC | PRN
  Administered 2016-03-07 (×2): 10 mL via PERINEURAL

## 2016-03-08 MED ORDER — KETOROLAC TROMETHAMINE 30 MG/ML IJ SOLN
INTRAMUSCULAR | Status: AC
  Filled 2016-03-08: qty 1

## 2016-03-08 MED ORDER — ROPIVACAINE HCL 5 MG/ML IJ SOLN
INTRAMUSCULAR | Status: DC | PRN
  Administered 2016-03-07 (×2): 10 mL via PERINEURAL

## 2016-03-08 NOTE — Addendum Note (Signed)
Addendum  created 03/08/16 0820 by Lorrene Reid, MD   Modules edited: Anesthesia Blocks and Procedures, Anesthesia Medication Administration, Clinical Notes   Clinical Notes:  File: [id number]; File: [id number]

## 2016-03-08 NOTE — Op Note (Signed)
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  Asst.:Dr Thimmappa   EBL: Minimal  Specimens: Bilateral breast tissue and one right axillary sentinel node. Biopsies of bilateral nipples and biopsy of bilateral superior flaps sent separately  Indications for procedure: Patient presents for bilateral nipple sparing mastectomy. She is a strong family history of breast cancer is a large area DCIS involving the right upper outer quadrant of her breast. She was seen in the multidisciplinary clinic and opted for the above.The surgical and non surgical options have been discussed with the patient.  Risks of surgery include bleeding,  Infection,  Flap necrosis,  Tissue loss,  Chronic pain, death, Numbness,  And the need for additional procedures.  Reconstruction options also have been discussed with the patient as well.  The patient agrees to proceed.Sentinel lymph node mapping and dissection has been discussed with the patient.  Risk of bleeding,  Infection,  Seroma formation,  Additional procedures,,  Shoulder weakness ,  Shoulder stiffness,  Nerve and blood vessel injury and reaction to the mapping dyes have been discussed.  Alternatives to surgery have been discussed with the patient.  The patient agrees to proceed.  We discussed specifics of nipple sparing mastectomy and the potential for nipple loss and flap necrosis requiring additional and/or revision surgery. She has been seen in the plastic surgery.  Description of procedure: The patient was met in the holding area and questions are answered. She underwent injection of the right breast with technetium Soffer colloid by radiology. She was taken back to the operating room and placed upon the OR table. After induction of general  anesthesia around her chest was prepped and draped in a sterile fashion bilaterally. Timeout was done and she received preoperative antibiotics. The left side was done first. The patient was marked by plastic surgery. She had a history of pre-existing implants. On the left, and inferior incision was made through the old scar of her implant. Dissection was carried down and the implant was removed after opening the capsule of this. She had very dense fibrocystic breast disease especially involving the upper part of both breasts. A superior skin flap was raised carefully up to the nipple. There is very dense adherent tissue to the undersurface the nipple we dissected this off. During the process a small defect was made in the nipple measuring 5 mm. Biopsy the nipple was benign. Once the nipple tissue was taken down remainder the breast tissue was excised up to the clavicle. Her pectoralis muscle was quite atrophic from the implants this was very difficult to find. We were able to identify this and dissect the breast tissue off of this posteriorly. All breast tissue was removed and passed off the field. This was oriented. Additional biopsies of the superior portion flap were taken since she had very dense fibrocystic disease that was adherent to the undersurface of the skin. These were sent separately. Hemostasis was achieved.  The right side was then done next. The sentinel node was done first. Neoprobe was used to identify hotspot in the right axilla. A 3 cm incision was made and dissection was carried down. 1 hot sentinel node was identified and removed in the level I axillary basin. The remainder of her background counts approached 0. This was irrigated, made hemostatic and closed with 3-0 Vicryl and 4-0  Monocryl. Mastectomy was done on the right side. A similar fashion the scar from her previous breast augmentation was utilized. Incision was made through this just at the inferior mammary crease and dissection was  carried down until the capsule the implant was identified. This was opened and the implant was removed and passed off the field. A similar fashion the pectoralis major muscle was identified on the right side was much more apparent. I cauterized the capsule just above this to create the posterior tissue plane. The anterior skin flaps were raised with cautery up to the nipple in the right nipple was biopsied and found to be negative. She had similar dense tissue involving the upper portions of her breast which made dissection very difficult. A small defect was created in the nipple in this side measuring 5 mm while trying dissect the tissue away from the nipple. Dissection was carried superiorly up to the clavicle and again she had very dense fibrocystic tissue making this very difficult to separate the skin in the sub-cutaneous breast tissue. Specimen was then removed in its entirety and oriented. Given that most of her DCIS was in the upper outer quadrant I took numerous biopsies of the superior skin flap and sent this separately. This was discussed with plastic surgery we both agreed with these were positive that this area would have to be excised and other reconstruction options considered. Hemostasis was achieved. Plastic surgery to convert this portion the case for the reconstruction part. Please see that note. At this point the patient was stable and all counts were correct and EBL was minimal.

## 2016-03-08 NOTE — Discharge Instructions (Signed)
°Post Anesthesia Home Care Instructions ° °Activity: °Get plenty of rest for the remainder of the day. A responsible adult should stay with you for 24 hours following the procedure.  °For the next 24 hours, DO NOT: °-Drive a car °-Operate machinery °-Drink alcoholic beverages °-Take any medication unless instructed by your physician °-Make any legal decisions or sign important papers. ° °Meals: °Start with liquid foods such as gelatin or soup. Progress to regular foods as tolerated. Avoid greasy, spicy, heavy foods. If nausea and/or vomiting occur, drink only clear liquids until the nausea and/or vomiting subsides. Call your physician if vomiting continues. ° °Special Instructions/Symptoms: °Your throat may feel dry or sore from the anesthesia or the breathing tube placed in your throat during surgery. If this causes discomfort, gargle with warm salt water. The discomfort should disappear within 24 hours. ° °If you had a scopolamine patch placed behind your ear for the management of post- operative nausea and/or vomiting: ° °1. The medication in the patch is effective for 72 hours, after which it should be removed.  Wrap patch in a tissue and discard in the trash. Wash hands thoroughly with soap and water. °2. You may remove the patch earlier than 72 hours if you experience unpleasant side effects which may include dry mouth, dizziness or visual disturbances. °3. Avoid touching the patch. Wash your hands with soap and water after contact with the patch. °  °About my Jackson-Pratt Bulb Drain ° °What is a Jackson-Pratt bulb? °A Jackson-Pratt is a soft, round device used to collect drainage. It is connected to a long, thin drainage catheter, which is held in place by one or two small stiches near your surgical incision site. When the bulb is squeezed, it forms a vacuum, forcing the drainage to empty into the bulb. ° °Emptying the Jackson-Pratt bulb- °To empty the bulb: °1. Release the plug on the top of the bulb. °2.  Pour the bulb's contents into a measuring container which your nurse will provide. °3. Record the time emptied and amount of drainage. Empty the drain(s) as often as your     doctor or nurse recommends. ° °Date                  Time                    Amount (Drain 1)                 Amount (Drain 2) ° °_____________________________________________________________________ ° °_____________________________________________________________________ ° °_____________________________________________________________________ ° °_____________________________________________________________________ ° °_____________________________________________________________________ ° °_____________________________________________________________________ ° °_____________________________________________________________________ ° °_____________________________________________________________________ ° °Squeezing the Jackson-Pratt Bulb- °To squeeze the bulb: °1. Make sure the plug at the top of the bulb is open. °2. Squeeze the bulb tightly in your fist. You will hear air squeezing from the bulb. °3. Replace the plug while the bulb is squeezed. °4. Use a safety pin to attach the bulb to your clothing. This will keep the catheter from     pulling at the bulb insertion site. ° °When to call your doctor- °Call your doctor if: °· Drain site becomes red, swollen or hot. °· You have a fever greater than 101 degrees F. °· There is oozing at the drain site. °· Drain falls out (apply a guaze bandage over the drain hole and secure it with tape). °· Drainage increases daily not related to activity patterns. (You will usually have more drainage when you are active than when you are resting.) °· Drainage has a bad   odor. ° ° °

## 2016-03-08 NOTE — Progress Notes (Addendum)
Dr. Iran Planas in to see patient, made aware of BP 85/52 and that patient has been running 90s/50s. Patient states she had some prior dizziness when getting up to the restroom, but none the last few times. No new orders given. Patient using incentive spirometer at 1000, and patient and husband instructed on stripping and emptying drains, then recording output. Dr. Brantley Stage also in to see patient, made aware of BP, states is nothing to be concerned with. MD also informed that patient main complain of pain described as tightness and pressure.

## 2016-04-04 ENCOUNTER — Other Ambulatory Visit: Payer: Self-pay | Admitting: *Deleted

## 2016-04-04 ENCOUNTER — Ambulatory Visit (HOSPITAL_BASED_OUTPATIENT_CLINIC_OR_DEPARTMENT_OTHER): Payer: 59 | Admitting: Oncology

## 2016-04-04 VITALS — BP 111/66 | HR 72 | Temp 98.5°F | Resp 18 | Ht 66.0 in | Wt 146.6 lb

## 2016-04-04 DIAGNOSIS — D0511 Intraductal carcinoma in situ of right breast: Secondary | ICD-10-CM

## 2016-04-04 DIAGNOSIS — Z79811 Long term (current) use of aromatase inhibitors: Secondary | ICD-10-CM

## 2016-04-04 DIAGNOSIS — C50411 Malignant neoplasm of upper-outer quadrant of right female breast: Secondary | ICD-10-CM

## 2016-04-04 MED ORDER — FLUCONAZOLE 100 MG PO TABS: 100.0000 mg | ORAL_TABLET | Freq: Every day | ORAL | 0 refills | Status: DC

## 2016-04-04 NOTE — Progress Notes (Signed)
[place]  Telephone:([phone]) [phone] Fax:(336) [phone]     ID: Belinda White DOB: 1971/09/03  MR#: [id number]  TMH#:[id number]  Patient Care Team: Leone Haven, MD as PCP - General (Family Medicine) Erroll Luna, MD as Consulting Physician (General Surgery) Thea Silversmith, MD as Consulting Physician (Radiation Oncology) Chauncey Cruel, MD as Consulting Physician (Oncology) Sylvan Cheese, NP as Nurse Practitioner (Hematology and Oncology) PCP: Leone Haven, MD GYN: OTHER MD:  CHIEF COMPLAINT: Ductal carcinoma in situ  CURRENT TREATMENT: Observation   BREAST CANCER HISTORY: From the original intake note:  Belinda White had routine screening mammography at [facility] 11/24/2015 with tomosynthesis. The breast density was category C. In the upper outer quadrant of the right breast there was an area of pleomorphic calcifications injuring 6 cm. She was recalled for unilateral diagnostic right mammography 11/26/2015, and this confirmed the earlier impression of a large area of suspicious calcifications involving both lower quadrants. Right breast ultrasonography on the same day showed a pattern suggestive of ductal ectasia with intraductal material in all quadrants, but no specific focus and a negative axilla.  On 11/29/2015 the patient underwent biopsy of 2 areas of calcifications, both in the lower quadrants, and both showed (SAA 17-05/10/2005) similar ductal carcinoma in situ, grade 1 or 2, strongly estrogen and progesterone receptor positive.  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 Since her last visit here she underwent bilateral nipple sparing mastectomies with right sentinel lymph node sampling. The final biopsy ([id number]), showed, on the left, only radial scar, no malignancy. There was also a fibroadenoma. On the right, there was ductal carcinoma in situ measuring  3.3 cm, high-grade, within 0.1 cm of the posterior margin broadly. The single sentinel lymph node was benign.  Her case was re-presented at the multidisciplinary breast cancer conference 03/29/2016. At that time the margin question was discussed. It is at the chest wall. No further surgery is indicated. It was felt the patient could consult with radiation for a discussion of adjuvant treatment. She might benefit from a breast MRI every 2 years   REVIEW OF SYSTEMS: Belinda White with the surgery. She did develop a rash very recently, and has been using a steroid cream with little results. She has pain at the surgical site, but this is less than expected and she is only taking Tylenol for this. She is generally pleased with the cosmetic results so far. A detailed review of systems today was otherwise benign  PAST MEDICAL HISTORY: Past Medical History:  Diagnosis Date  . Breast cancer ([place])   . Complication of anesthesia   . Family history of breast cancer   . Family history of leukemia   . Family history of prostate cancer   . PONV (postoperative nausea and vomiting)     PAST SURGICAL HISTORY: Past Surgical History:  Procedure Laterality Date  . BREAST ENHANCEMENT SURGERY    . BREAST IMPLANT REMOVAL Bilateral 03/07/2016   Procedure: REMOVAL BREAST IMPLANTS;  Surgeon: Irene Limbo, MD;  Location: [place];  Service: Plastics;  Laterality: Bilateral;  . BREAST RECONSTRUCTION WITH PLACEMENT OF TISSUE EXPANDER AND FLEX HD (ACELLULAR HYDRATED DERMIS) Bilateral 03/07/2016   Procedure: BILATERAL BREAST RECONSTRUCTION WITH PLACEMENT OF TISSUE EXPANDER ;  Surgeon: Irene Limbo, MD;  Location: [place];  Service: Plastics;  Laterality: Bilateral;  . CESAREAN SECTION    . NIPPLE SPARING MASTECTOMY/SENTINAL LYMPH NODE BIOPSY/RECONSTRUCTION/PLACEMENT  OF TISSUE EXPANDER Bilateral 03/07/2016   Procedure: LEFT PROPHYLACTIC NIPPLE SPARING MASTECTOMY AND  RIGHT  NIPPLE SPARING MASTECTOMY WITH RIGHT SENTINAL LYMPH NODE BIOPSY ;  Surgeon: Erroll Luna, MD;  Location: [place];  Service: General;  Laterality: Bilateral;  . TONSILLECTOMY      FAMILY HISTORY Family History  Problem Relation Age of Onset  . Breast cancer Maternal Grandmother 60  . COPD Maternal Grandmother   . Leukemia Father     dx in his 61s-40s  . Heart attack Paternal Uncle   . Prostate cancer Maternal Grandfather   . Lung cancer Maternal Grandfather   . Stroke Paternal Grandmother   . Lung cancer Paternal Grandfather   . Diabetes type I Maternal Uncle   . Breast cancer Cousin     mother's maternal cousin  . Breast cancer Cousin     mother's maternal cousin's daughter  . Breast cancer Cousin     father's maternal cousin  The patient's father is 74 and the patient's mother 79 as of March 2017. The patient had no siblings. The maternal grandmother was diagnosed with breast cancer at age 74. She survive  GYNECOLOGIC HISTORY:  Patient's last menstrual period was 02/07/2016. Menarche age 55 and first live birth age 35, the patient is GX P3. She still having regular periods. She has used oral contraceptives and most recently Depo purpura for contraception. 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 Her husband Belinda White is Librarian, academic of a local company. Daughter Belinda White lives in [place] where she works as an Surveyor, minerals. Daughter Belinda White lives in [place] and is unemployed at present. Daughter Belinda White is 20 and lives with the patient at [place].    ADVANCED DIRECTIVES: Not in place   HEALTH MAINTENANCE: Social History  Substance Use Topics  . Smoking status: Never Smoker  . Smokeless tobacco: Not on file  . Alcohol use Yes     Comment: social     Colonoscopy:  PAP:  Bone density:  Lipid panel:  No Known Allergies  Current Outpatient Prescriptions  Medication Sig Dispense Refill  .  cetirizine (ZYRTEC) 10 MG tablet Take 10 mg by mouth daily.    . tamoxifen (NOLVADEX) 20 MG tablet Take 20 mg by mouth daily.     No current facility-administered medications for this visit.     OBJECTIVE: Young white woman Who appears stated age 68:   04/04/16 1123  BP: 111/66  Pulse: 72  Resp: 18  Temp: 98.5 F (36.9 C)     Body mass index is 23.66 kg/m.    ECOG FS:1 - Symptomatic but completely ambulatory  Sclerae unicteric, pupils round and equal Oropharynx clear and moist-- no thrush or other lesions No cervical or supraclavicular adenopathy Lungs no rales or rhonchi Heart regular rate and rhythm Abd soft, nontender, positive bowel sounds MSK no focal spinal tenderness, no upper extremity lymphedema Neuro: nonfocal, White oriented, appropriate affect Breasts: Status post bilateral nipple sparing mastectomies with expanders in place. There is general symmetry, with some alteration of the breast contour on the right greater than left. Both axillae are benign. Skin: Erythematous palpable rash consistent with a fungal eruption  SULTS:  CMP     Component Value Date/Time   NA 141 12/08/2015 0834   K 4.1 12/08/2015 0834   CO2 27 12/08/2015 0834   GLUCOSE 81 12/08/2015 0834   BUN 14.8 12/08/2015 0834   CREATININE 0.8 12/08/2015 0834  CALCIUM 9.6 12/08/2015 0834   PROT 7.2 12/08/2015 0834   ALBUMIN 3.9 12/08/2015 0834   AST 12 12/08/2015 0834   ALT 9 12/08/2015 0834   ALKPHOS 53 12/08/2015 0834   BILITOT 0.38 12/08/2015 0834    INo results found for: SPEP, UPEP  Lab Results  Component Value Date   WBC 7.3 12/08/2015   NEUTROABS 4.9 12/08/2015   HGB 14.3 12/08/2015   HCT 42.8 12/08/2015   MCV 93.5 12/08/2015   PLT 311 12/08/2015      Chemistry      Component Value Date/Time   NA 141 12/08/2015 0834   K 4.1 12/08/2015 0834   CO2 27 12/08/2015 0834   BUN 14.8 12/08/2015 0834   CREATININE 0.8 12/08/2015 0834      Component Value Date/Time   CALCIUM  9.6 12/08/2015 0834   ALKPHOS 53 12/08/2015 0834   AST 12 12/08/2015 0834   ALT 9 12/08/2015 0834   BILITOT 0.38 12/08/2015 0834       No results found for: LABCA2  No components found for: LABCA125  No results for input(s): INR in the last 168 hours.  Urinalysis No results found for: COLORURINE, APPEARANCEUR, LABSPEC, PHURINE, GLUCOSEU, HGBUR, BILIRUBINUR, KETONESUR, PROTEINUR, UROBILINOGEN, NITRITE, LEUKOCYTESUR    ELIGIBLE FOR AVAILABLE RESEARCH PROTOCOL: No  STUDIES: Nm Sentinel Node Inj-no Rpt (breast)  Result Date: 03/07/2016 CLINICAL DATA: right breast DCIS Sulfur colloid was injected intradermally by the nuclear medicine technologist for breast cancer sentinel node localization.     ASSESSMENT: 45 y.o. [place], [place] woman status post right breast lower quadrant biopsy 2 for ductal carcinoma in situ, grade 1 or 2, estrogen and progesterone receptor positive.  (1) Bilateral nipple sparing mastectomies 03/07/2016 showed  (a) no malignancy on the left  (b) ductal carcinoma in situ measuring 3.3 cm on the right, grade 3, with broadly positive deep margin (at muscle).   (2) genetics testing 12/16/2015 through the Breast/Ovarian gene panel offered by [facility] found no deleterious mutations in  ATM, BARD1, BRCA1, BRCA2, BRIP1, CDH1, CHEK2, EPCAM, FANCC, MLH1, MSH2, MSH6, NBN, PALB2, PMS2, PTEN, RAD51C, RAD51D, TP53, and XRCC2  (3) neoadjuvant tamoxifen started in light of possible delays to definitive surgery while genetics testinging and plastic surgery consultation was obtained, discontinued at the time of surgery  PLAN: We spent approximately 35 minutes today going over Chinyere's situation.  In general DCIS is cured with mastectomy. However our concern is that she had a broadly positive posterior margin-- there may be some residual tumor on the other side, namely at muscle.  This was discussed at conference. We do not feel any further surgery is feasible or  advisable. Instead the patient could consider adjuvant radiation, continuing tamoxifen, or simple observation.  . We discussed those options today. She is led away from radiation because of concerns regarding reconstruction and cosmesis. Vada is also not very motivated in continuing tamoxifen for 5 years for a very uncertain gain, given in particular the problems she experienced with hot flashes while on that medication  She is accordingly opting for observation alone. I am comfortable with that option. We are going to do MRIs of the breast every 2 years beginning May of next year as suggested at conference. That order has been placed. She will see menext June  to discuss results. If there has been no disease activity after 6-8 years, or 3-4 MRIs, we will probably discontinue follow-up here  I started her on Diflucan for her cancer rash. If that  does not significantly improved in the next 2 days she will let me know.  Chauncey Cruel, MD   04/04/2016 11:24 AM Medical Oncology and Hematology [facility] [address] [place], [place] [address] Tel. [phone]    Fax. [phone]

## 2016-04-05 ENCOUNTER — Other Ambulatory Visit: Payer: Self-pay | Admitting: *Deleted

## 2016-04-05 DIAGNOSIS — C50411 Malignant neoplasm of upper-outer quadrant of right female breast: Secondary | ICD-10-CM

## 2016-05-03 ENCOUNTER — Telehealth: Payer: Self-pay

## 2016-05-03 DIAGNOSIS — C50411 Malignant neoplasm of upper-outer quadrant of right female breast: Secondary | ICD-10-CM

## 2016-05-03 NOTE — Telephone Encounter (Signed)
Cathy with [facility] called with request for order change from bilat breast MRI with contrast to with and without contrast. Order corrected.

## 2016-05-29 NOTE — H&P (Signed)
Subjective:    Patient ID: Belinda White is a 45 y.o. female.  HPI  3 months post op bilateral NSM with removal of her saline implants, TE reconstruction. Developed what appeared to be rash following surgery- seen by Dr Elvera [facility] end August who felt this was benign skin condition that was self limited, not related to prep or tissue glue and felt safe to proceed with surgery.  Presented following screening MMG demonstrating in the upper outer quadrant of the right breast pleomorphic calcifications spanning 6 cm. Diagnostic MMG confirmedlarge area of suspicious calcifications involving both lower quadrants. [place] with negative axilla. Biopsy of 2 areas of calcifications, both in the lower quadrants, showed DCIS, ER/PR +. She was started on tamoxifen neoadjuvantly. Genetics normal. Final pathology left fibrocystic, right high grade DCIS spanning 3.3 cm that comes within <0.1 cm posterior margin broadly. 0/1 SLN. Plan MRI q 2 years for surveillance given close posterior margin, ordered for May 2018.  History significant for augmentation saline submuscular 2012 with Dr. Nathanial Rancher. Prior to mastectomy DD, happy with this. Prior to augmentation A/B cup.  Patient had Mentor Smooth Round Moderate Profile, Style 1600, 375 ml implants RIGHT fil 375 ml, LEFT fill 390 ml  Right mastectomy 276 g Left 202 g     Objective:   Physical Exam  Cardiovascular: Normal rate, regular rhythm and normal heart sounds.   Pulmonary/Chest: Effort normal and breath sounds normal.  Abdominal: Soft.  No hernias   Chest:  right chest lower pole not as well expanded SN to nipple R 22 L 22 cm BW R 13 L 13 cm Nipple to IMF R 7 L 7 cm    Assessment:     R breast DCIS S/p augmentation mammaplasty S/p bilateral NSM, removal implants, placement TE    Plan:      Plan second stage for implant exchange, she has elected for saline as she had in past. Reviewed implant risks including infection requiring  additional surgery or removal, contracture, rippling, animation deformity. Will plan for lipofilling to improve contour and thickness flaps. Discussed donor site incisions, pain, bruising, need for compression for 6 weeks, variable take graft, risk fat necrosis, need to repeat treatment. Desires larger than present, to return to preop size.   Natrelle 133MX-12-T 400 ml tissue expanders placed bilateral,  RIGHT fill volume 400 ml  LEFT fill volume 400 ml   Irene Limbo, MD [facility] Plastic & Reconstructive Surgery [phone], pin [phone]

## 2016-06-02 ENCOUNTER — Encounter (HOSPITAL_BASED_OUTPATIENT_CLINIC_OR_DEPARTMENT_OTHER): Payer: Self-pay | Admitting: *Deleted

## 2016-06-09 ENCOUNTER — Encounter (HOSPITAL_BASED_OUTPATIENT_CLINIC_OR_DEPARTMENT_OTHER): Payer: Self-pay | Admitting: *Deleted

## 2016-06-09 ENCOUNTER — Ambulatory Visit (HOSPITAL_BASED_OUTPATIENT_CLINIC_OR_DEPARTMENT_OTHER)
Admission: RE | Admit: 2016-06-09 | Discharge: 2016-06-09 | Disposition: A | Discharge status: Home / Self Care | Payer: 59 | Source: Ambulatory Visit | Attending: Plastic Surgery | Admitting: Plastic Surgery

## 2016-06-09 ENCOUNTER — Ambulatory Visit (HOSPITAL_BASED_OUTPATIENT_CLINIC_OR_DEPARTMENT_OTHER): Payer: 59 | Admitting: Anesthesiology

## 2016-06-09 ENCOUNTER — Encounter (HOSPITAL_BASED_OUTPATIENT_CLINIC_OR_DEPARTMENT_OTHER)
Admission: RE | Disposition: A | Discharge status: Home / Self Care | Payer: Self-pay | Source: Ambulatory Visit | Attending: Plastic Surgery

## 2016-06-09 DIAGNOSIS — Z9886 Personal history of breast implant removal: Secondary | ICD-10-CM | POA: Insufficient documentation

## 2016-06-09 DIAGNOSIS — Z79810 Long term (current) use of selective estrogen receptor modulators (SERMs): Secondary | ICD-10-CM | POA: Diagnosis not present

## 2016-06-09 DIAGNOSIS — Z170 Estrogen receptor positive status [ER+]: Secondary | ICD-10-CM | POA: Insufficient documentation

## 2016-06-09 DIAGNOSIS — D0511 Intraductal carcinoma in situ of right breast: Secondary | ICD-10-CM | POA: Insufficient documentation

## 2016-06-09 DIAGNOSIS — Z421 Encounter for breast reconstruction following mastectomy: Secondary | ICD-10-CM | POA: Diagnosis present

## 2016-06-09 HISTORY — PX: LIPOSUCTION WITH LIPOFILLING: SHX6436

## 2016-06-09 HISTORY — PX: REMOVAL OF BILATERAL TISSUE EXPANDERS WITH PLACEMENT OF BILATERAL BREAST IMPLANTS: SHX6431

## 2016-06-09 HISTORY — DX: Personal history of malignant neoplasm of breast: Z85.3

## 2016-06-09 SURGERY — REMOVAL, TISSUE EXPANDER, BREAST, BILATERAL, WITH BILATERAL IMPLANT IMPLANT INSERTION
Anesthesia: General | Site: Breast | Laterality: Bilateral

## 2016-06-09 MED ORDER — HYDROMORPHONE HCL 1 MG/ML IJ SOLN
INTRAMUSCULAR | Status: AC
  Filled 2016-06-09: qty 1

## 2016-06-09 MED ORDER — LIDOCAINE 2% (20 MG/ML) 5 ML SYRINGE
INTRAMUSCULAR | Status: AC
  Filled 2016-06-09: qty 5

## 2016-06-09 MED ORDER — SULFAMETHOXAZOLE-TRIMETHOPRIM 800-160 MG PO TABS: 1.0000 | ORAL_TABLET | Freq: Two times a day (BID) | ORAL | 0 refills | Status: AC

## 2016-06-09 MED ORDER — ROCURONIUM BROMIDE 10 MG/ML (PF) SYRINGE
PREFILLED_SYRINGE | INTRAVENOUS | Status: AC
  Filled 2016-06-09: qty 10

## 2016-06-09 MED ORDER — ROCURONIUM BROMIDE 100 MG/10ML IV SOLN
INTRAVENOUS | Status: DC | PRN
  Administered 2016-06-09: 50 mg via INTRAVENOUS

## 2016-06-09 MED ORDER — SUGAMMADEX SODIUM 200 MG/2ML IV SOLN
INTRAVENOUS | Status: AC
  Filled 2016-06-09: qty 2

## 2016-06-09 MED ORDER — DEXAMETHASONE SODIUM PHOSPHATE 10 MG/ML IJ SOLN
INTRAMUSCULAR | Status: AC
  Filled 2016-06-09: qty 1

## 2016-06-09 MED ORDER — SODIUM CHLORIDE 0.9 % IV SOLN
INTRAVENOUS | Status: DC | PRN
  Administered 2016-06-09: 1000 mL

## 2016-06-09 MED ORDER — CELECOXIB 200 MG PO CAPS
ORAL_CAPSULE | ORAL | Status: AC
  Filled 2016-06-09: qty 2

## 2016-06-09 MED ORDER — LIDOCAINE 2% (20 MG/ML) 5 ML SYRINGE
INTRAMUSCULAR | Status: DC | PRN
Start: 2016-06-09 — End: 2016-06-09
  Administered 2016-06-09: 60 mg via INTRAVENOUS

## 2016-06-09 MED ORDER — EPHEDRINE 5 MG/ML INJ
INTRAVENOUS | Status: AC
  Filled 2016-06-09: qty 10

## 2016-06-09 MED ORDER — SODIUM BICARBONATE 4 % IV SOLN
INTRAVENOUS | Status: DC | PRN
  Administered 2016-06-09: 450 mL via INTRAMUSCULAR

## 2016-06-09 MED ORDER — OXYCODONE HCL 5 MG PO TABS: 5.0000 mg | ORAL_TABLET | ORAL | 0 refills | Status: AC | PRN

## 2016-06-09 MED ORDER — GLYCOPYRROLATE 0.2 MG/ML IJ SOLN: 0.2000 mg | Freq: Once | INTRAMUSCULAR | Status: DC | PRN

## 2016-06-09 MED ORDER — LACTATED RINGERS IV SOLN
INTRAVENOUS | Status: DC
  Administered 2016-06-09 (×2): via INTRAVENOUS

## 2016-06-09 MED ORDER — PROMETHAZINE HCL 25 MG/ML IJ SOLN
INTRAMUSCULAR | Status: AC
  Filled 2016-06-09: qty 1

## 2016-06-09 MED ORDER — OXYCODONE HCL 5 MG PO TABS
ORAL_TABLET | ORAL | Status: AC
  Filled 2016-06-09: qty 1

## 2016-06-09 MED ORDER — CHLORHEXIDINE GLUCONATE CLOTH 2 % EX PADS: 6.0000 | MEDICATED_PAD | Freq: Once | CUTANEOUS | Status: DC

## 2016-06-09 MED ORDER — OXYCODONE HCL 5 MG PO TABS
5.0000 mg | ORAL_TABLET | Freq: Once | ORAL | Status: AC
  Administered 2016-06-09: 5 mg via ORAL

## 2016-06-09 MED ORDER — HYDROMORPHONE HCL 1 MG/ML IJ SOLN
0.2500 mg | INTRAMUSCULAR | Status: DC | PRN
  Administered 2016-06-09 (×2): 0.5 mg via INTRAVENOUS

## 2016-06-09 MED ORDER — GABAPENTIN 300 MG PO CAPS
ORAL_CAPSULE | ORAL | Status: AC
  Filled 2016-06-09: qty 1

## 2016-06-09 MED ORDER — MIDAZOLAM HCL 2 MG/2ML IJ SOLN
INTRAMUSCULAR | Status: AC
  Filled 2016-06-09: qty 2

## 2016-06-09 MED ORDER — EPHEDRINE SULFATE-NACL 50-0.9 MG/10ML-% IV SOSY
PREFILLED_SYRINGE | INTRAVENOUS | Status: DC | PRN
  Administered 2016-06-09: 10 mg via INTRAVENOUS

## 2016-06-09 MED ORDER — MIDAZOLAM HCL 2 MG/2ML IJ SOLN
1.0000 mg | INTRAMUSCULAR | Status: DC | PRN
  Administered 2016-06-09: 2 mg via INTRAVENOUS

## 2016-06-09 MED ORDER — FENTANYL CITRATE (PF) 100 MCG/2ML IJ SOLN
INTRAMUSCULAR | Status: AC
  Filled 2016-06-09: qty 2

## 2016-06-09 MED ORDER — ONDANSETRON HCL 4 MG/2ML IJ SOLN
INTRAMUSCULAR | Status: AC
  Filled 2016-06-09: qty 2

## 2016-06-09 MED ORDER — SCOPOLAMINE 1 MG/3DAYS TD PT72
MEDICATED_PATCH | TRANSDERMAL | Status: AC
  Filled 2016-06-09: qty 1

## 2016-06-09 MED ORDER — SCOPOLAMINE 1 MG/3DAYS TD PT72
1.0000 | MEDICATED_PATCH | Freq: Once | TRANSDERMAL | Status: DC | PRN
  Administered 2016-06-09: 1.5 mg via TRANSDERMAL

## 2016-06-09 MED ORDER — EPHEDRINE SULFATE 50 MG/ML IJ SOLN: INTRAMUSCULAR | Status: DC | PRN

## 2016-06-09 MED ORDER — SUGAMMADEX SODIUM 200 MG/2ML IV SOLN
INTRAVENOUS | Status: DC | PRN
  Administered 2016-06-09: 200 mg via INTRAVENOUS

## 2016-06-09 MED ORDER — ACETAMINOPHEN 500 MG PO TABS
ORAL_TABLET | ORAL | Status: AC
  Filled 2016-06-09: qty 2

## 2016-06-09 MED ORDER — CEFAZOLIN SODIUM-DEXTROSE 2-4 GM/100ML-% IV SOLN
INTRAVENOUS | Status: AC
  Filled 2016-06-09: qty 100

## 2016-06-09 MED ORDER — GABAPENTIN 300 MG PO CAPS
300.0000 mg | ORAL_CAPSULE | ORAL | Status: AC
  Administered 2016-06-09: 300 mg via ORAL

## 2016-06-09 MED ORDER — PROPOFOL 10 MG/ML IV BOLUS
INTRAVENOUS | Status: DC | PRN
  Administered 2016-06-09: 30 mg via INTRAVENOUS
  Administered 2016-06-09: 130 mg via INTRAVENOUS

## 2016-06-09 MED ORDER — CEFAZOLIN SODIUM-DEXTROSE 2-4 GM/100ML-% IV SOLN
2.0000 g | INTRAVENOUS | Status: AC
  Administered 2016-06-09: 2 g via INTRAVENOUS

## 2016-06-09 MED ORDER — ONDANSETRON HCL 4 MG/2ML IJ SOLN
INTRAMUSCULAR | Status: DC | PRN
  Administered 2016-06-09: 4 mg via INTRAVENOUS

## 2016-06-09 MED ORDER — PROMETHAZINE HCL 25 MG/ML IJ SOLN
6.2500 mg | Freq: Once | INTRAMUSCULAR | Status: AC
  Administered 2016-06-09: 6.25 mg via INTRAVENOUS

## 2016-06-09 MED ORDER — DEXAMETHASONE SODIUM PHOSPHATE 4 MG/ML IJ SOLN
INTRAMUSCULAR | Status: DC | PRN
  Administered 2016-06-09: 10 mg via INTRAVENOUS

## 2016-06-09 MED ORDER — ACETAMINOPHEN 500 MG PO TABS
1000.0000 mg | ORAL_TABLET | ORAL | Status: AC
  Administered 2016-06-09: 1000 mg via ORAL

## 2016-06-09 MED ORDER — FENTANYL CITRATE (PF) 100 MCG/2ML IJ SOLN
INTRAMUSCULAR | Status: AC
  Filled 2016-06-09: qty 4

## 2016-06-09 MED ORDER — FENTANYL CITRATE (PF) 100 MCG/2ML IJ SOLN
50.0000 ug | INTRAMUSCULAR | Status: AC | PRN
  Administered 2016-06-09: 50 ug via INTRAVENOUS
  Administered 2016-06-09 (×2): 25 ug via INTRAVENOUS
  Administered 2016-06-09: 50 ug via INTRAVENOUS
  Administered 2016-06-09 (×2): 100 ug via INTRAVENOUS

## 2016-06-09 MED ORDER — CELECOXIB 400 MG PO CAPS
400.0000 mg | ORAL_CAPSULE | ORAL | Status: AC
  Administered 2016-06-09: 400 mg via ORAL

## 2016-06-09 SURGICAL SUPPLY — 80 items
BAG DECANTER FOR FLEXI CONT (MISCELLANEOUS) ×4 IMPLANT
BINDER ABDOMINAL 10 UNV 27-48 (MISCELLANEOUS) IMPLANT
BINDER ABDOMINAL 12 SM 30-45 (SOFTGOODS) IMPLANT
BINDER BREAST 3XL (BIND) IMPLANT
BINDER BREAST LRG (GAUZE/BANDAGES/DRESSINGS) IMPLANT
BINDER BREAST MEDIUM (GAUZE/BANDAGES/DRESSINGS) IMPLANT
BINDER BREAST XLRG (GAUZE/BANDAGES/DRESSINGS) IMPLANT
BINDER BREAST XXLRG (GAUZE/BANDAGES/DRESSINGS) IMPLANT
BLADE SURG 10 STRL SS (BLADE) ×4 IMPLANT
BLADE SURG 11 STRL SS (BLADE) ×4 IMPLANT
BNDG GAUZE ELAST 4 BULKY (GAUZE/BANDAGES/DRESSINGS) ×8 IMPLANT
CANISTER LIPO FAT HARVEST (MISCELLANEOUS) ×4 IMPLANT
CANISTER SUCT 1200ML W/VALVE (MISCELLANEOUS) ×4 IMPLANT
CHLORAPREP W/TINT 26ML (MISCELLANEOUS) ×8 IMPLANT
COMPRESSION GARMENT LG MOREWEL (MISCELLANEOUS) IMPLANT
COMPRESSION GARMENT MD MOREWEL (MISCELLANEOUS) IMPLANT
COMPRESSION GARMENT XL MOREWEL (MISCELLANEOUS) IMPLANT
COVER MAYO STAND STRL (DRAPES) ×4 IMPLANT
DECANTER SPIKE VIAL GLASS SM (MISCELLANEOUS) IMPLANT
DRAIN CHANNEL 15F RND FF W/TCR (WOUND CARE) IMPLANT
DRAPE IMP U-DRAPE 54X76 (DRAPES) IMPLANT
DRSG PAD ABDOMINAL 8X10 ST (GAUZE/BANDAGES/DRESSINGS) ×16 IMPLANT
ELECT BLADE 4.0 EZ CLEAN MEGAD (MISCELLANEOUS) ×4
ELECT COATED BLADE 2.86 ST (ELECTRODE) ×4 IMPLANT
ELECT REM PT RETURN 9FT ADLT (ELECTROSURGICAL) ×4
ELECTRODE BLDE 4.0 EZ CLN MEGD (MISCELLANEOUS) ×2 IMPLANT
ELECTRODE REM PT RTRN 9FT ADLT (ELECTROSURGICAL) ×2 IMPLANT
EVACUATOR SILICONE 100CC (DRAIN) IMPLANT
FILTER LIPOSUCTION (MISCELLANEOUS) ×4 IMPLANT
GLOVE BIO SURGEON STRL SZ 6 (GLOVE) ×12 IMPLANT
GLOVE BIO SURGEON STRL SZ7 (GLOVE) ×4 IMPLANT
GLOVE BIOGEL PI IND STRL 6.5 (GLOVE) ×2 IMPLANT
GLOVE BIOGEL PI INDICATOR 6.5 (GLOVE) ×2
GOWN STRL REUS W/ TWL LRG LVL3 (GOWN DISPOSABLE) ×4 IMPLANT
GOWN STRL REUS W/TWL LRG LVL3 (GOWN DISPOSABLE) ×4
IMPL BREAST SAL HP 560CC (Breast) ×4 IMPLANT
IMPLANT BREAST SAL HP 560CC (Breast) ×8 IMPLANT
IV NS 500ML (IV SOLUTION) ×6
IV NS 500ML BAXH (IV SOLUTION) ×6 IMPLANT
KIT FILL SYSTEM UNIVERSAL (SET/KITS/TRAYS/PACK) IMPLANT
LINER CANISTER 1000CC FLEX (MISCELLANEOUS) ×4 IMPLANT
LIQUID BAND (GAUZE/BANDAGES/DRESSINGS) ×8 IMPLANT
NDL SAFETY ECLIPSE 18X1.5 (NEEDLE) IMPLANT
NEEDLE FILTER BLUNT 18X 1/2SAF (NEEDLE) ×4
NEEDLE FILTER BLUNT 18X1 1/2 (NEEDLE) ×4 IMPLANT
NEEDLE HYPO 18GX1.5 SHARP (NEEDLE)
NEEDLE HYPO 25X1 1.5 SAFETY (NEEDLE) IMPLANT
NS IRRIG 1000ML POUR BTL (IV SOLUTION) ×4 IMPLANT
PACK BASIN DAY SURGERY FS (CUSTOM PROCEDURE TRAY) ×4 IMPLANT
PACK UNIVERSAL I (CUSTOM PROCEDURE TRAY) ×4 IMPLANT
PAD ALCOHOL SWAB (MISCELLANEOUS) ×4 IMPLANT
PENCIL BUTTON HOLSTER BLD 10FT (ELECTRODE) ×4 IMPLANT
PIN SAFETY STERILE (MISCELLANEOUS) IMPLANT
SHEET MEDIUM DRAPE 40X70 STRL (DRAPES) ×8 IMPLANT
SIZER BREAST SGL 500CC (SIZER) ×4 IMPLANT
SIZER BREAST SGL USE 500CC (SIZER) ×4
SLEEVE SCD COMPRESS KNEE MED (MISCELLANEOUS) ×4 IMPLANT
SPONGE LAP 18X18 X RAY DECT (DISPOSABLE) ×12 IMPLANT
STAPLER VISISTAT 35W (STAPLE) ×8 IMPLANT
SUT ETHILON 2 0 FS 18 (SUTURE) IMPLANT
SUT MNCRL AB 4-0 PS2 18 (SUTURE) ×8 IMPLANT
SUT PDS AB 2-0 CT2 27 (SUTURE) IMPLANT
SUT VIC AB 3-0 PS1 18 (SUTURE)
SUT VIC AB 3-0 PS1 18XBRD (SUTURE) IMPLANT
SUT VIC AB 3-0 SH 27 (SUTURE) ×4
SUT VIC AB 3-0 SH 27X BRD (SUTURE) ×4 IMPLANT
SUT VICRYL 4-0 PS2 18IN ABS (SUTURE) ×8 IMPLANT
SYR 3ML 23GX1 SAFETY (SYRINGE) ×4 IMPLANT
SYR 50ML LL SCALE MARK (SYRINGE) ×20 IMPLANT
SYR BULB IRRIGATION 50ML (SYRINGE) ×8 IMPLANT
SYR CONTROL 10ML LL (SYRINGE) IMPLANT
SYR TB 1ML LL NO SAFETY (SYRINGE) IMPLANT
SYRINGE 10CC LL (SYRINGE) ×12 IMPLANT
TOWEL OR 17X24 6PK STRL BLUE (TOWEL DISPOSABLE) ×12 IMPLANT
TUBE CONNECTING 20'X1/4 (TUBING) ×2
TUBE CONNECTING 20X1/4 (TUBING) ×6 IMPLANT
TUBING INFILTRATION IT-10001 (TUBING) ×4 IMPLANT
TUBING SET GRADUATE ASPIR 12FT (MISCELLANEOUS) ×4 IMPLANT
UNDERPAD 30X30 (UNDERPADS AND DIAPERS) ×8 IMPLANT
YANKAUER SUCT BULB TIP NO VENT (SUCTIONS) ×4 IMPLANT

## 2016-06-09 NOTE — Interval H&P Note (Signed)
History and Physical Interval Note:  06/09/2016 8:01 AM  Belinda White  has presented today for surgery, with the diagnosis of hx of breast cancer aquired absent breast  The various methods of treatment have been discussed with the patient and family. After consideration of risks, benefits and other options for treatment, the patient has consented to  Procedure(s): REMOVAL OF BILATERAL TISSUE EXPANDERS WITH PLACEMENT OF BILATERAL BREAST IMPLANTS and lipofilling to bilateral chest (Bilateral) LIPOSUCTION WITH LIPOFILLING (Bilateral) as a surgical intervention .  The patient's history has been reviewed, patient examined, no change in status, stable for surgery.  I have reviewed the patient's chart and labs.  Questions were answered to the patient's satisfaction.     Ikran Patman

## 2016-06-09 NOTE — Anesthesia Postprocedure Evaluation (Signed)
Anesthesia Post Note  Patient: Belinda White  Procedure(s) Performed: Procedure(s) (LRB): REMOVAL OF BILATERAL TISSUE EXPANDERS WITH PLACEMENT OF BILATERAL BREAST IMPLANTS (Bilateral) LIPOSUCTION WITH LIPOFILLING FROM ABDOMEN TO BILATERAL CHEST (Bilateral)  Patient location during evaluation: PACU Anesthesia Type: General Level of consciousness: awake and alert Pain management: pain level controlled Vital Signs Assessment: post-procedure vital signs reviewed and stable Respiratory status: spontaneous breathing, nonlabored ventilation and respiratory function stable Cardiovascular status: blood pressure returned to baseline and stable Postop Assessment: no signs of nausea or vomiting Anesthetic complications: no    Last Vitals:  Vitals:   06/09/16 1127 06/09/16 1128  BP: 126/70   Pulse:  (!) 110  Resp: (!) 22 11  Temp:      Last Pain:  Vitals:   06/09/16 1200  TempSrc:   PainSc: 4                  Trissa Molina,W. EDMOND

## 2016-06-09 NOTE — Anesthesia Procedure Notes (Signed)
Procedure Name: Intubation Date/Time: 06/09/2016 8:49 AM Performed by: Lyndee Leo Pre-anesthesia Checklist: Patient identified, Emergency Drugs available, Suction available and Patient being monitored Patient Re-evaluated:Patient Re-evaluated prior to inductionOxygen Delivery Method: Circle system utilized Preoxygenation: Pre-oxygenation with 100% oxygen Intubation Type: IV induction Ventilation: Mask ventilation without difficulty Laryngoscope Size: Miller, 2 and Glidescope Grade View: Grade IV Tube type: Oral Tube size: 7.0 mm Number of attempts: 3 Airway Equipment and Method: Stylet,  Oral airway and Video-laryngoscopy Placement Confirmation: positive ETCO2 and breath sounds checked- equal and bilateral Secured at: 22 cm Tube secured with: Tape Dental Injury: Teeth and Oropharynx as per pre-operative assessment  Difficulty Due To: Difficulty was anticipated and Difficult Airway- due to anterior larynx Future Recommendations: Recommend- induction with short-acting agent, and alternative techniques readily available Comments: DL w Sabra Heck #2 x2. Epiglottis on structure visualized. Glidescope x1. Marland Kitchen

## 2016-06-09 NOTE — Anesthesia Preprocedure Evaluation (Addendum)
Anesthesia Evaluation  Patient identified by MRN, date of birth, ID band  Reviewed: Allergy & Precautions, H&P , NPO status , Patient's Chart, lab work & pertinent test results  History of Anesthesia Complications (+) PONV  Airway Mallampati: III  TM Distance: >3 FB Neck ROM: Full    Dental no notable dental hx. (+) Teeth Intact, Dental Advisory Given   Pulmonary neg pulmonary ROS,    Pulmonary exam normal breath sounds clear to auscultation       Cardiovascular negative cardio ROS   Rhythm:Regular Rate:Normal     Neuro/Psych negative neurological ROS  negative psych ROS   GI/Hepatic negative GI ROS, Neg liver ROS,   Endo/Other  negative endocrine ROS  Renal/GU negative Renal ROS  negative genitourinary   Musculoskeletal   Abdominal   Peds  Hematology negative hematology ROS (+)   Anesthesia Other Findings   Reproductive/Obstetrics negative OB ROS                            Anesthesia Physical Anesthesia Plan  ASA: II  Anesthesia Plan: General   Post-op Pain Management:    Induction: Intravenous  Airway Management Planned: LMA and Oral ETT  Additional Equipment:   Intra-op Plan:   Post-operative Plan: Extubation in OR  Informed Consent: I have reviewed the patients History and Physical, chart, labs and discussed the procedure including the risks, benefits and alternatives for the proposed anesthesia with the patient or authorized representative who has indicated his/her understanding and acceptance.   Dental advisory given  Plan Discussed with: CRNA  Anesthesia Plan Comments:         Anesthesia Quick Evaluation

## 2016-06-09 NOTE — Discharge Instructions (Signed)

## 2016-06-09 NOTE — Transfer of Care (Signed)
Immediate Anesthesia Transfer of Care Note  Patient: Belinda White  Procedure(s) Performed: Procedure(s): REMOVAL OF BILATERAL TISSUE EXPANDERS WITH PLACEMENT OF BILATERAL BREAST IMPLANTS (Bilateral) LIPOSUCTION WITH LIPOFILLING FROM ABDOMEN TO BILATERAL CHEST (Bilateral)  Patient Location: PACU  Anesthesia Type:General  Level of Consciousness: awake, alert  and patient cooperative  Airway & Oxygen Therapy: Patient Spontanous Breathing and Patient connected to face mask oxygen  Post-op Assessment: Report given to RN, Post -op Vital signs reviewed and stable and Patient moving all extremities  Post vital signs: Reviewed and stable  Last Vitals:  Vitals:   06/09/16 0804  BP: 108/71  Resp: 18  Temp: 36.8 C    Last Pain:  Vitals:   06/09/16 0804  TempSrc: Oral      Patients Stated Pain Goal: 0 ([id number])  Complications: No apparent anesthesia complications

## 2016-06-09 NOTE — Op Note (Signed)
Operative Note   DATE OF OPERATION: 9.29.17  LOCATION: [facility]-outpatient  SURGICAL DIVISION: Plastic Surgery  PREOPERATIVE DIAGNOSES:  1. History breast cancer 2. Acquired absence breasts  POSTOPERATIVE DIAGNOSES:  same  PROCEDURE:  1. Removal bilateral tissue expanders and placement saline implants 2. Lipofilling from abdomen to bilateral chest  SURGEON: Irene Limbo MD MBA  ASSISTANT: non3  ANESTHESIA:  General.   EBL: 123XX123 ml  COMPLICATIONS: None immediate.   INDICATIONS FOR PROCEDURE:  The patient, Belinda White, is a 45 y.o. female born on 1971/04/15, is here for second stage breast reconstruction following nipple sparing mastectomies and immediate tissue expander reconstruction. She has a history of prior breast augmentation.   FINDINGS: 75 ml fat infiltrated over right chest, 65 ml over left. Mentor Smooth Round High Profile Saline implants 560-675 ml, Right fill volume 650 ml Left fill volume 600 ml. REF [id number] RIGHT SN [id number] LEFT SN [id number]  DESCRIPTION OF PROCEDURE:  The patient's operative site was marked with the patient in the preoperative area to mark sternal notch, chest midline, anterior axillary lines, inframammary folds and areas of depression marked for lipofilling. Area for fat harvest from supra and infra umbilical abdomen marked.The patient was taken to the operating room. SCDs were placed and IV antibiotics were given. A time out was performed and all information was confirmed to be correct. The patient's chest and abdomen were then prepped and draped in a sterile fashion.   Incision made in right chest inframammary fold mastectomy scar. Incision carried through to capsule and expander removed. Capsulotomies performed superiorly and submuscular dissection completed. Additional capsulotomies performed in radial scoring fashion over lower pole. Sizer was placed, and I then directed attention to left breast. Incision made in prior scar  and expander removed. Capsulotomies performed superiorly and submuscular dissection completed.Sizer placed and patient brought to upright sitting position. A Mentor Smooth Saline High Profile Round 560-675 ml implant selected.   Stab incision made over bilateral lateral abdomen and tumescent fluid infiltrated over supraumbilical abdomen and bilateral flanks, total 400 ml tumescent infiltrated. Power assisted liposuction performed to endpoint symmetric contour and soft tissue thickness, total lipoaspirate 450 ml. The fat was then washed and prepared by gravity for infiltration. Harvested fat was then infiltrated in subcutaneous and intramuscular planes over superior medial poles, lateral chest wall, and throughout bilateral mastectomy flaps.   At this time bilateral cavities irrigated with solution containing Ancef, gentamicin, an bacitracin. Pocket inspected for hemostasis and then irrigated with Betadine.  Implant placed in each cavity and filled. Patient was brought to sitting position again and final fill volume left 600 ml and right 650 ml selected. Fill ports removed and tabs ensured in place. Closure was completed with running 3-0 vicryl for approximation of capsule and superficial fascia. 4-0 vicryl was placed in dermis and running 4-0 monocryl was used to close skin. Tissue adhesive was applied to IMF incisions. The abdominal incisions were approximated with 4-0 monocryl. Dry dressing and abdominal and breast binder applied.   The patient was allowed to wake from anesthesia, extubated and taken to the recovery room in satisfactory condition.   SPECIMENS:none  DRAINS: none  Irene Limbo, MD [facility] Plastic & Reconstructive Surgery [phone], pin [phone]

## 2016-06-12 ENCOUNTER — Encounter (HOSPITAL_BASED_OUTPATIENT_CLINIC_OR_DEPARTMENT_OTHER): Payer: Self-pay | Admitting: Plastic Surgery

## 2016-10-11 ENCOUNTER — Encounter (HOSPITAL_COMMUNITY): Payer: Self-pay

## 2017-02-27 ENCOUNTER — Other Ambulatory Visit: Payer: 59

## 2017-02-27 ENCOUNTER — Ambulatory Visit: Payer: 59 | Admitting: Oncology

## 2017-03-24 ENCOUNTER — Telehealth: Payer: Self-pay | Admitting: Oncology

## 2017-03-24 NOTE — Telephone Encounter (Signed)
sw pt to confirm 8/17 appt at 11 am per sch msg

## 2017-04-05 ENCOUNTER — Ambulatory Visit
Admission: RE | Admit: 2017-04-05 | Discharge: 2017-04-05 | Disposition: A | Discharge status: Home / Self Care | Payer: BLUE CROSS/BLUE SHIELD | Source: Ambulatory Visit | Attending: Oncology | Admitting: Oncology

## 2017-04-05 DIAGNOSIS — C50411 Malignant neoplasm of upper-outer quadrant of right female breast: Secondary | ICD-10-CM

## 2017-04-05 MED ORDER — GADOBENATE DIMEGLUMINE 529 MG/ML IV SOLN
12.0000 mL | Freq: Once | INTRAVENOUS | Status: AC | PRN
  Administered 2017-04-05: 12 mL via INTRAVENOUS

## 2017-04-15 ENCOUNTER — Other Ambulatory Visit: Payer: Self-pay | Admitting: Oncology

## 2017-04-27 ENCOUNTER — Ambulatory Visit: Payer: 59 | Admitting: Oncology

## 2018-10-05 IMAGING — MR MR BILATERAL BREAST WITHOUT AND WITH CONTRAST
8 of 13 series · 30 of 48 positions shown · IV contrast (13cc multihance)
Comparison: Previous exam(s).

CLINICAL DATA: Patient is status post bilateral mastectomies with
implant/lipo filler reconstructions of both breasts last year for
right breast cancer. At mastectomy, the patient had broadly positive
posterior margin of the right breast.

LABS:  Does not apply
EXAM:
BILATERAL BREAST MRI WITH AND WITHOUT CONTRAST
TECHNIQUE: Multiplanar, multisequence MR images of both breasts were obtained
prior to and following the intravenous administration of 12 ml of
MultiHance.

[Series 2: t2_tirm_tra ipat (a-p) · axial · 3.0mm · 0.70mm/px · z∈[-67,+95]mm · 3 of 55 slices shown (1 of 2)]
[im 1/55]
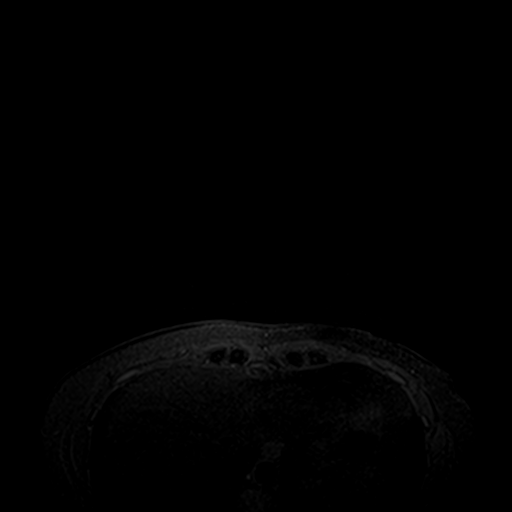
[im 28/55]
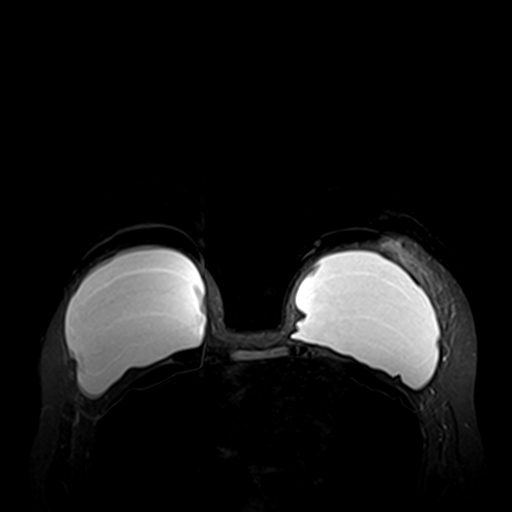
[im 55/55]
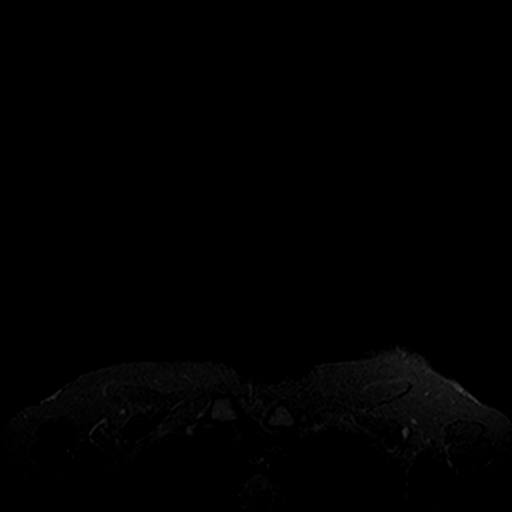

[Series 3: fl3d pre-cm no · axial · non-contrast · 1.2mm · 0.94mm/px · z∈[-72,+99]mm · 5 of 144 slices shown]
[im 1/144]
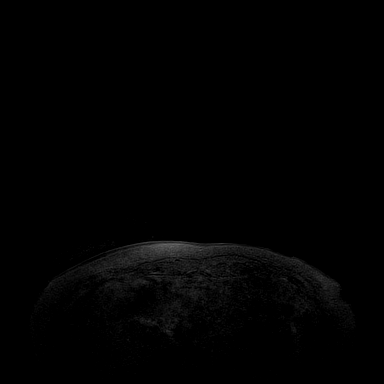
[im 36/144]
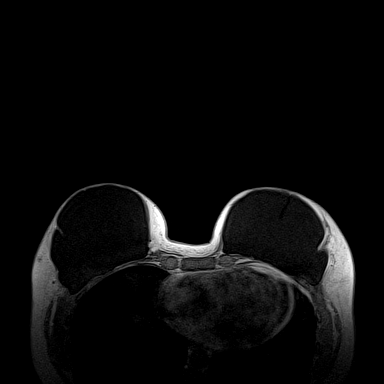
[im 72/144]
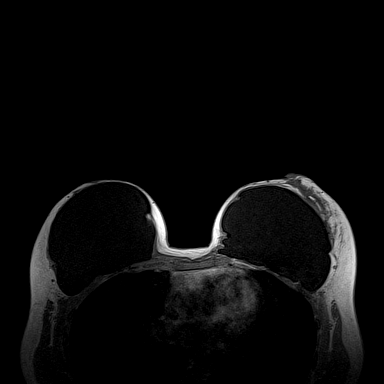
[im 108/144]
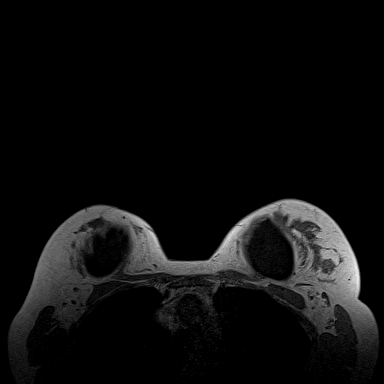
[im 144/144]
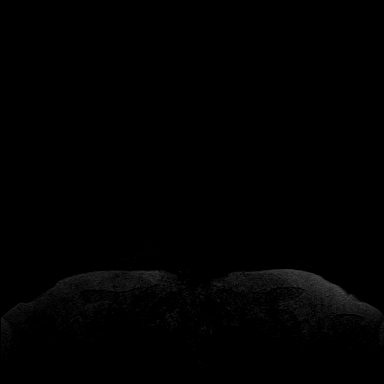

[Series 4: t2_tirm_tra ipat (a-p) · axial · 3.0mm · 0.70mm/px · z∈[-67,+95]mm · 2 of 55 slices shown (2 of 2)]
[im 1/55]
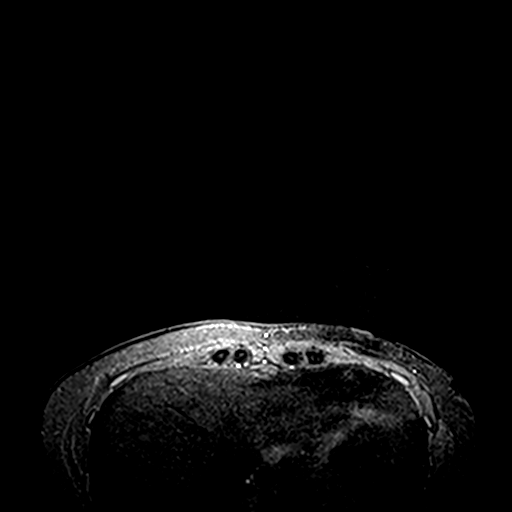
[im 55/55]
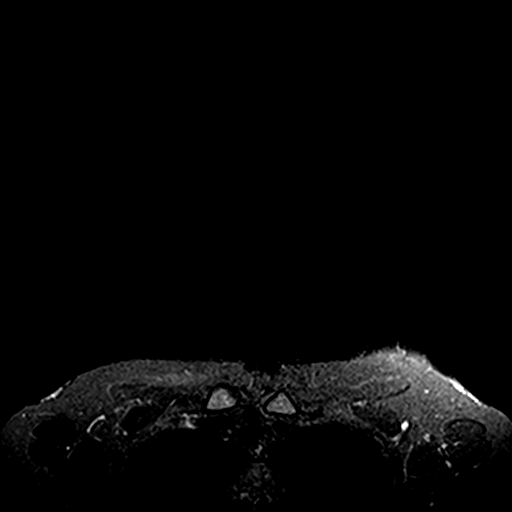

[Series 5: fl3d pre-cm · axial · non-contrast · 1.2mm · 0.94mm/px · z∈[-72,+99]mm · 5 of 144 slices shown]
[im 1/144]
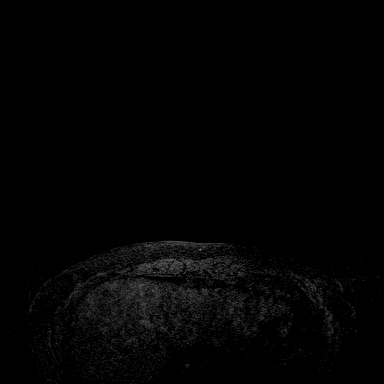
[im 36/144]
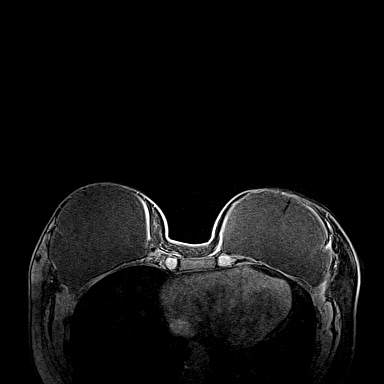
[im 72/144]
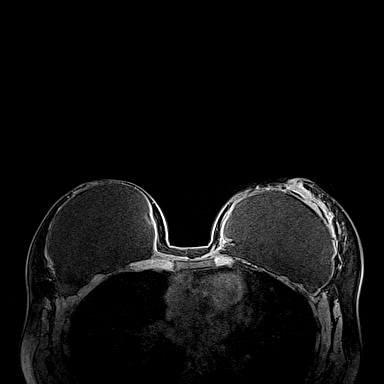
[im 108/144]
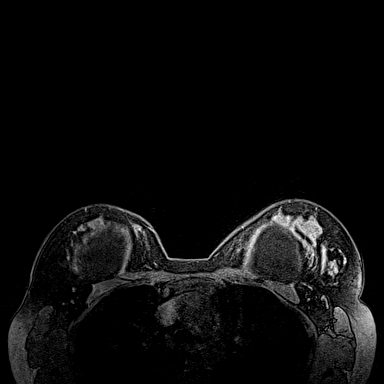
[im 144/144]
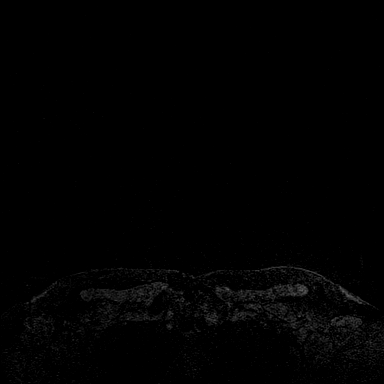

[Series 6: fl3d post immediate · axial · 1.2mm · 0.94mm/px · z∈[-72,+99]mm · 5 of 144 slices shown (1 of 3)]
[im 1/144]
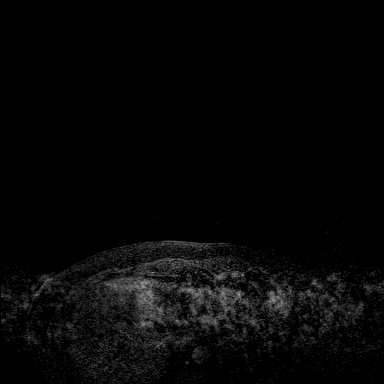
[im 36/144]
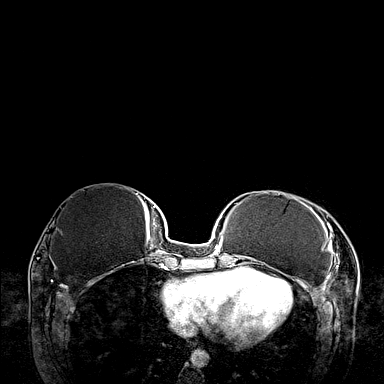
[im 72/144]
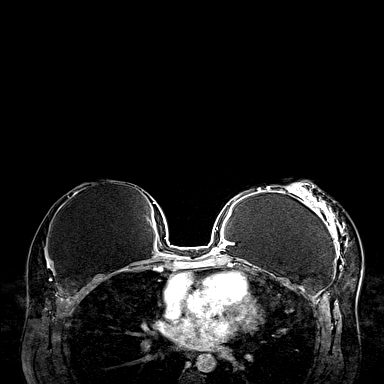
[im 108/144]
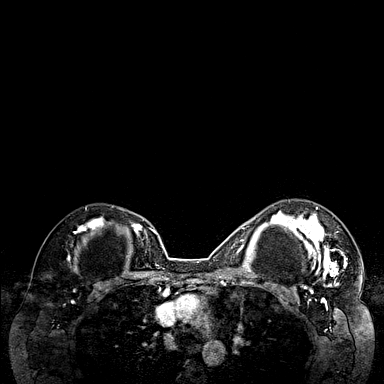
[im 144/144]
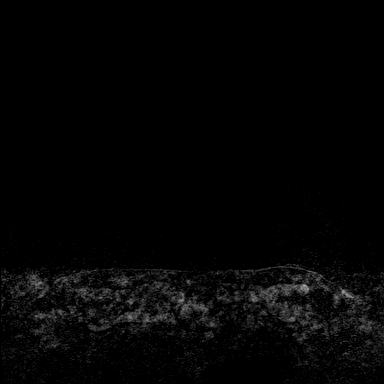

[Series 7: fl3d post immediate · axial · 1.2mm · 0.94mm/px · z∈[-72,+99]mm · 5 of 144 slices shown (2 of 3)]
[im 1/144]
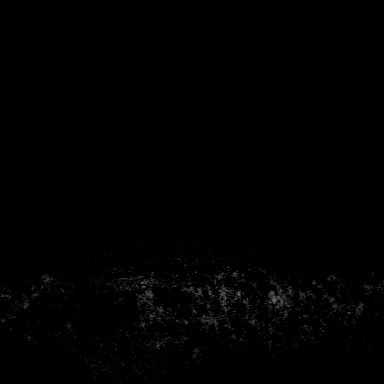
[im 36/144]
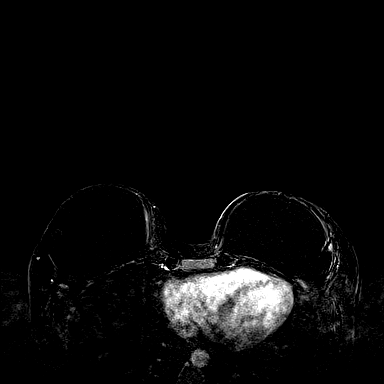
[im 72/144]
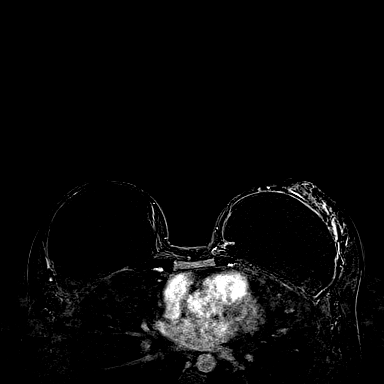
[im 108/144]
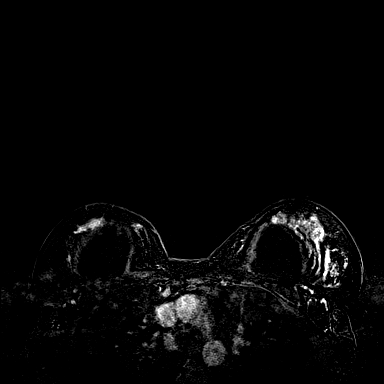
[im 144/144]
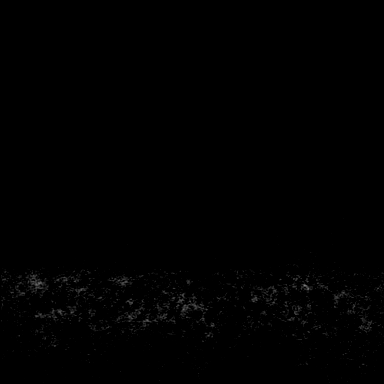

[Series 8: fl3d post immediate · axial · 172.8mm · 0.94mm/px · 1 of 1 slices shown (3 of 3)]
[im 1/1]
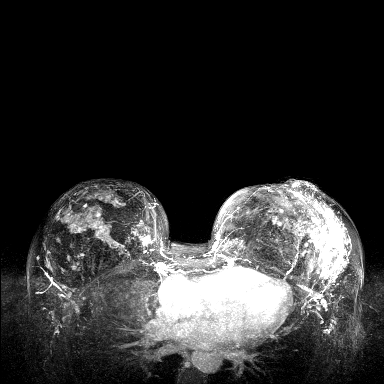

[Series 9: fl3d post 3min · axial · 1.2mm · 0.94mm/px · z∈[-72,+56]mm · 4 of 144 slices shown]
[im 1/144]
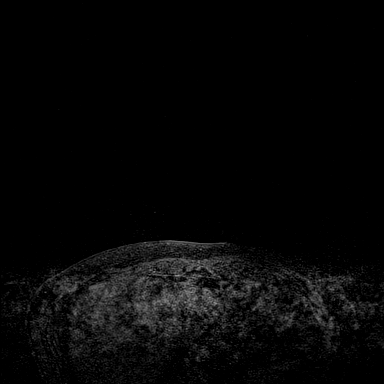
[im 36/144]
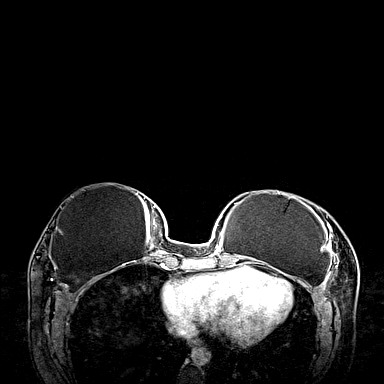
[im 72/144]
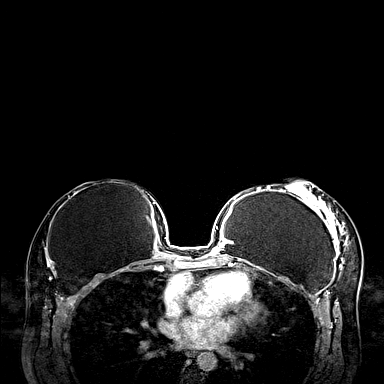
[im 108/144]
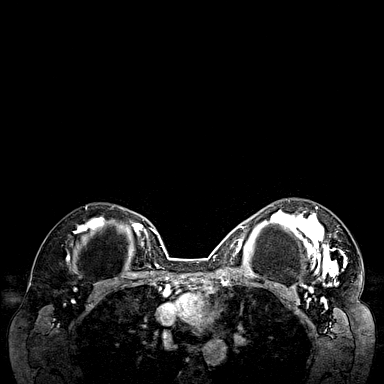

[30 of 48 positions shown; findings below may reference images not displayed]

THREE-DIMENSIONAL MR IMAGE RENDERING ON INDEPENDENT WORKSTATION:

Three-dimensional MR images were rendered by post-processing of the
original MR data on an independent workstation. The
three-dimensional MR images were interpreted, and findings are
reported in the following complete MRI report for this study. Three
dimensional images were evaluated at the independent [employer]
workstation
FINDINGS: Breast composition: Does not apply. Patient status post bilateral
mastectomy.

Background parenchymal enhancement: Moderate.

Right breast: No mass or abnormal enhancement. Generalized
enhancement of the lipo filler/fat necrosis are noted. Breast
implant is intact

Left breast: No mass or abnormal enhancement.Generalized enhancement
of the lipo filler/fat necrosis are noted. Breast implant is intact

Lymph nodes: No abnormal appearing lymph nodes.

Ancillary findings:  None.
IMPRESSION: Benign findings.

RECOMMENDATION:
Screening breast MRI in 1 year.

BI-RADS CATEGORY  2: Benign.

## 2019-10-21 ENCOUNTER — Ambulatory Visit (INDEPENDENT_AMBULATORY_CARE_PROVIDER_SITE_OTHER): Payer: Self-pay | Admitting: Otolaryngology

## 2019-10-21 ENCOUNTER — Ambulatory Visit (INDEPENDENT_AMBULATORY_CARE_PROVIDER_SITE_OTHER): Payer: BC Managed Care – PPO | Admitting: Otolaryngology

## 2019-10-21 ENCOUNTER — Encounter (INDEPENDENT_AMBULATORY_CARE_PROVIDER_SITE_OTHER): Payer: Self-pay | Admitting: Otolaryngology

## 2019-10-21 ENCOUNTER — Ambulatory Visit (INDEPENDENT_AMBULATORY_CARE_PROVIDER_SITE_OTHER): Payer: BLUE CROSS/BLUE SHIELD | Admitting: Otolaryngology

## 2019-10-21 ENCOUNTER — Other Ambulatory Visit: Payer: Self-pay

## 2019-10-21 VITALS — Temp 98.2°F

## 2019-10-21 DIAGNOSIS — E041 Nontoxic single thyroid nodule: Secondary | ICD-10-CM | POA: Diagnosis not present

## 2019-10-21 NOTE — Progress Notes (Signed)
HPI: Belinda White is a 49 y.o. female who presents is referred by her PCP for evaluation of thyroid nodule.  Apparently she was seen by her PCP in [place] who noticed a thyroid nodule and sent her for ultrasound to have this evaluated.  Unfortunately we do not have the ultrasound results as of this visit. She has had no hoarseness. She has had no difficulty swallowing.. She has had history of breast cancer in 2017 status post bilateral mastectomies with negative nodes.  Past Medical History:  Diagnosis Date  . Complication of anesthesia   . History of breast cancer 02/2016  . PONV (postoperative nausea and vomiting)    Past Surgical History:  Procedure Laterality Date  . BREAST ENHANCEMENT SURGERY    . BREAST IMPLANT REMOVAL Bilateral 03/07/2016   Procedure: REMOVAL BREAST IMPLANTS;  Surgeon: Irene Limbo, MD;  Location: [facility];  Service: Plastics;  Laterality: Bilateral;  . BREAST RECONSTRUCTION WITH PLACEMENT OF TISSUE EXPANDER AND FLEX HD (ACELLULAR HYDRATED DERMIS) Bilateral 03/07/2016   Procedure: BILATERAL BREAST RECONSTRUCTION WITH PLACEMENT OF TISSUE EXPANDER ;  Surgeon: Irene Limbo, MD;  Location: [facility];  Service: Plastics;  Laterality: Bilateral;  . CESAREAN SECTION    . LIPOSUCTION WITH LIPOFILLING Bilateral 06/09/2016   Procedure: LIPOSUCTION WITH LIPOFILLING FROM ABDOMEN TO BILATERAL CHEST;  Surgeon: Irene Limbo, MD;  Location: [facility];  Service: Plastics;  Laterality: Bilateral;  . NIPPLE SPARING MASTECTOMY/SENTINAL LYMPH NODE BIOPSY/RECONSTRUCTION/PLACEMENT OF TISSUE EXPANDER Bilateral 03/07/2016   Procedure: LEFT PROPHYLACTIC NIPPLE SPARING MASTECTOMY AND RIGHT  NIPPLE SPARING MASTECTOMY WITH RIGHT SENTINAL LYMPH NODE BIOPSY ;  Surgeon: Erroll Luna, MD;  Location: [facility];  Service: General;  Laterality: Bilateral;  . REMOVAL OF BILATERAL TISSUE EXPANDERS WITH PLACEMENT OF  BILATERAL BREAST IMPLANTS Bilateral 06/09/2016   Procedure: REMOVAL OF BILATERAL TISSUE EXPANDERS WITH PLACEMENT OF BILATERAL BREAST IMPLANTS;  Surgeon: Irene Limbo, MD;  Location: [facility];  Service: Plastics;  Laterality: Bilateral;  . TONSILLECTOMY    . TYMPANOSTOMY TUBE PLACEMENT     as a child   Social History   Socioeconomic History  . Marital status: Married    Spouse name: Pilar Plate  . Number of children: 3  . Years of education: Not on file  . Highest education level: Not on file  Occupational History  . Not on file  Tobacco Use  . Smoking status: Never Smoker  . Smokeless tobacco: Never Used  Substance and Sexual Activity  . Alcohol use: Yes    Comment: rarely  . Drug use: No  . Sexual activity: Yes    Birth control/protection: Surgical, Other-see comments    Comment: husband - vasectomy  Other Topics Concern  . Not on file  Social History Narrative  . Not on file   Social Determinants of Health   Financial Resource Strain:   . Difficulty of Paying Living Expenses: Not on file  Food Insecurity:   . Worried About Charity fundraiser in the Last Year: Not on file  . Ran Out of Food in the Last Year: Not on file  Transportation Needs:   . Lack of Transportation (Medical): Not on file  . Lack of Transportation (Non-Medical): Not on file  Physical Activity:   . Days of Exercise per Week: Not on file  . Minutes of Exercise per Session: Not on file  Stress:   . Feeling of Stress : Not on file  Social Connections:   .  Frequency of Communication with Friends and Family: Not on file  . Frequency of Social Gatherings with Friends and Family: Not on file  . Attends Religious Services: Not on file  . Active Member of Clubs or Organizations: Not on file  . Attends Archivist Meetings: Not on file  . Marital Status: Not on file   Family History  Problem Relation Age of Onset  . Breast cancer Maternal Grandmother 56  . COPD Maternal  Grandmother   . Leukemia Father        dx in his 63s-40s  . Heart attack Paternal Uncle   . Prostate cancer Maternal Grandfather   . Lung cancer Maternal Grandfather   . Stroke Paternal Grandmother   . Lung cancer Paternal Grandfather   . Diabetes type I Maternal Uncle   . Breast cancer Cousin        mother's maternal cousin  . Breast cancer Cousin        mother's maternal cousin's daughter  . Breast cancer Cousin        father's maternal cousin   Allergies  Allergen Reactions  . Other Rash    TISSUE GLUE/Dermabond  . Flumadine [Rimantadine] Rash   Prior to Admission medications   Medication Sig Start Date End Date Taking? Authorizing Provider  Aspirin-Acetaminophen-Caffeine (GOODY HEADACHE PO) Take by mouth.   Yes [provider]  cetirizine (ZYRTEC) 10 MG tablet Take 10 mg by mouth daily.   Yes [provider]  oxyCODONE (ROXICODONE) 5 MG immediate release tablet Take 1-2 tablets (5-10 mg total) by mouth every 4 (four) hours as needed for severe pain. 06/09/16  Yes Irene Limbo, MD  sulfamethoxazole-trimethoprim (BACTRIM DS,SEPTRA DS) 800-160 MG tablet Take 1 tablet by mouth 2 (two) times daily. 06/09/16  Yes Irene Limbo, MD     Positive ROS: Otherwise negative.  All other systems have been reviewed and were otherwise negative with the exception of those mentioned in the HPI and as above.  Physical Exam: Constitutional: Alert, well-appearing, no acute distress Ears: External ears without lesions or tenderness. Ear canals are clear bilaterally with intact, clear TMs.  Nasal: External nose without lesions. Septum relatively midline.. Clear nasal passages Oral: Lips and gums without lesions. Tongue and palate mucosa without lesions. Posterior oropharynx clear. Neck: No palpable adenopathy or masses.  She has some slight thyroid fullness but no do really appreciate discrete nodules.  She has no obvious supraclavicular adenopathy noted.  No adenopathy  along the jugular chain of lymph nodes. Respiratory: Breathing comfortably  Skin: No facial/neck lesions or rash noted.  Procedures  Assessment: Thyroid nodule or cyst noticed on recent ultrasound  Plan: I will need to review the ultrasound and possibly schedule ultrasound-guided FNA of the thyroid nodule if indicated. I reviewed this with the patient in the office today.   Radene Journey, MD   CC:

## 2019-10-24 ENCOUNTER — Ambulatory Visit (INDEPENDENT_AMBULATORY_CARE_PROVIDER_SITE_OTHER): Payer: Self-pay | Admitting: Otolaryngology

## 2019-10-27 ENCOUNTER — Ambulatory Visit (INDEPENDENT_AMBULATORY_CARE_PROVIDER_SITE_OTHER): Payer: Self-pay | Admitting: Otolaryngology

## 2020-06-01 ENCOUNTER — Encounter (INDEPENDENT_AMBULATORY_CARE_PROVIDER_SITE_OTHER): Payer: Self-pay
